# Patient Record
Sex: Male | Born: 1970 | Race: White | Hispanic: No | Marital: Married | Smoking: Current every day smoker
Health system: Southern US, Community
[De-identification: ages and names within clinical notes are randomized; demographics above are authoritative.]

## PROBLEM LIST (undated history)

## (undated) DIAGNOSIS — Z8719 Personal history of other diseases of the digestive system: Secondary | ICD-10-CM

## (undated) DIAGNOSIS — E785 Hyperlipidemia, unspecified: Secondary | ICD-10-CM

## (undated) DIAGNOSIS — K921 Melena: Secondary | ICD-10-CM

## (undated) DIAGNOSIS — Z8661 Personal history of infections of the central nervous system: Secondary | ICD-10-CM

## (undated) DIAGNOSIS — Z8619 Personal history of other infectious and parasitic diseases: Secondary | ICD-10-CM

## (undated) DIAGNOSIS — Z8701 Personal history of pneumonia (recurrent): Secondary | ICD-10-CM

## (undated) HISTORY — DX: Personal history of other diseases of the digestive system: Z87.19

## (undated) HISTORY — DX: Personal history of infections of the central nervous system: Z86.61

## (undated) HISTORY — DX: Personal history of pneumonia (recurrent): Z87.01

## (undated) HISTORY — DX: Melena: K92.1

## (undated) HISTORY — PX: SPINE SURGERY: SHX786

## (undated) HISTORY — DX: Hyperlipidemia, unspecified: E78.5

## (undated) HISTORY — DX: Personal history of other infectious and parasitic diseases: Z86.19

---

## 2000-03-12 ENCOUNTER — Emergency Department (HOSPITAL_COMMUNITY): Admission: EM | Admit: 2000-03-12 | Discharge: 2000-03-12 | Payer: Self-pay | Admitting: *Deleted

## 2003-04-29 ENCOUNTER — Emergency Department (HOSPITAL_COMMUNITY): Admission: EM | Admit: 2003-04-29 | Discharge: 2003-04-29 | Payer: Self-pay

## 2004-08-06 ENCOUNTER — Ambulatory Visit: Payer: Self-pay | Admitting: Internal Medicine

## 2004-11-23 ENCOUNTER — Ambulatory Visit: Payer: Self-pay | Admitting: Internal Medicine

## 2005-01-19 ENCOUNTER — Ambulatory Visit: Payer: Self-pay | Admitting: Internal Medicine

## 2005-05-24 ENCOUNTER — Ambulatory Visit: Payer: Self-pay | Admitting: Internal Medicine

## 2005-07-06 ENCOUNTER — Ambulatory Visit: Payer: Self-pay | Admitting: Internal Medicine

## 2005-07-07 ENCOUNTER — Ambulatory Visit: Payer: Self-pay | Admitting: Internal Medicine

## 2005-08-02 ENCOUNTER — Ambulatory Visit: Payer: Self-pay | Admitting: Internal Medicine

## 2005-11-29 ENCOUNTER — Ambulatory Visit: Payer: Self-pay | Admitting: Internal Medicine

## 2007-03-08 ENCOUNTER — Ambulatory Visit: Payer: Self-pay | Admitting: Internal Medicine

## 2008-04-03 ENCOUNTER — Ambulatory Visit: Payer: Self-pay | Admitting: Internal Medicine

## 2008-04-03 DIAGNOSIS — M79609 Pain in unspecified limb: Secondary | ICD-10-CM | POA: Insufficient documentation

## 2008-04-03 DIAGNOSIS — F3289 Other specified depressive episodes: Secondary | ICD-10-CM | POA: Insufficient documentation

## 2008-04-03 DIAGNOSIS — F329 Major depressive disorder, single episode, unspecified: Secondary | ICD-10-CM

## 2008-04-03 DIAGNOSIS — F411 Generalized anxiety disorder: Secondary | ICD-10-CM | POA: Insufficient documentation

## 2008-04-03 DIAGNOSIS — F4310 Post-traumatic stress disorder, unspecified: Secondary | ICD-10-CM | POA: Insufficient documentation

## 2008-07-30 ENCOUNTER — Ambulatory Visit: Payer: Self-pay | Admitting: Internal Medicine

## 2008-07-30 DIAGNOSIS — J069 Acute upper respiratory infection, unspecified: Secondary | ICD-10-CM | POA: Insufficient documentation

## 2008-07-30 DIAGNOSIS — H9190 Unspecified hearing loss, unspecified ear: Secondary | ICD-10-CM | POA: Insufficient documentation

## 2010-10-22 ENCOUNTER — Ambulatory Visit: Payer: Self-pay | Admitting: Internal Medicine

## 2013-10-17 ENCOUNTER — Telehealth: Payer: Self-pay

## 2013-10-17 NOTE — Telephone Encounter (Signed)
Agreed with ER.

## 2013-10-17 NOTE — Telephone Encounter (Signed)
I called pts wife Zachary Leon to get update and pt is not hurting and refuses to seek any medical assistance now. Zachary Leon pt needs to be evaluated at ED or UC. Zachary Leon voiced understanding.

## 2013-10-17 NOTE — Telephone Encounter (Signed)
pts wife,Shannon called; pt is in [place] driving on highway; pt pulled off the road and called 911 but could not get thru;? Problem with cell phone and location; pt called Larene Beach and said pt having severe chest pain that radiated down lt arm with SOB. Pt is not hurting now and said would wait for 1st appt with Dr Glori Bickers on 11/13/13. Pt has not seen a doctor in at least 3 years according to pts wife. No hx of heart condition but Larene Beach said last couple of nights pt has complained with really bad indigestion. Randa Ngo if pts pain was severe with radiation down lt arm and SOB even though not hurting now pt needs to pull over and try 911 again or go to UC or ED wherever pt is in [facility]. Larene Beach said she will call pt and advise.

## 2013-11-13 ENCOUNTER — Ambulatory Visit (INDEPENDENT_AMBULATORY_CARE_PROVIDER_SITE_OTHER): Payer: BC Managed Care – PPO | Admitting: Family Medicine

## 2013-11-13 ENCOUNTER — Encounter: Payer: Self-pay | Admitting: Family Medicine

## 2013-11-13 ENCOUNTER — Ambulatory Visit (INDEPENDENT_AMBULATORY_CARE_PROVIDER_SITE_OTHER)
Admission: RE | Admit: 2013-11-13 | Discharge: 2013-11-13 | Disposition: A | Discharge status: Home / Self Care | Payer: BC Managed Care – PPO | Source: Ambulatory Visit | Attending: Family Medicine | Admitting: Family Medicine

## 2013-11-13 VITALS — BP 128/92 | HR 62 | Temp 98.4°F | Ht 70.5 in | Wt 211.2 lb

## 2013-11-13 DIAGNOSIS — R5383 Other fatigue: Secondary | ICD-10-CM | POA: Insufficient documentation

## 2013-11-13 DIAGNOSIS — R413 Other amnesia: Secondary | ICD-10-CM

## 2013-11-13 DIAGNOSIS — R079 Chest pain, unspecified: Secondary | ICD-10-CM | POA: Insufficient documentation

## 2013-11-13 DIAGNOSIS — K219 Gastro-esophageal reflux disease without esophagitis: Secondary | ICD-10-CM | POA: Insufficient documentation

## 2013-11-13 DIAGNOSIS — F17200 Nicotine dependence, unspecified, uncomplicated: Secondary | ICD-10-CM | POA: Insufficient documentation

## 2013-11-13 DIAGNOSIS — K5900 Constipation, unspecified: Secondary | ICD-10-CM

## 2013-11-13 DIAGNOSIS — K5909 Other constipation: Secondary | ICD-10-CM

## 2013-11-13 DIAGNOSIS — R5381 Other malaise: Secondary | ICD-10-CM

## 2013-11-13 HISTORY — DX: Other amnesia: R41.3

## 2013-11-13 MED ORDER — OMEPRAZOLE 20 MG PO CPDR: 20.0000 mg | DELAYED_RELEASE_CAPSULE | Freq: Every day | ORAL | Status: DC

## 2013-11-13 NOTE — Patient Instructions (Addendum)
Follow up in about 2 weeks to review test results EKG and chest xray and labs today  Start omeprazole 20 mg each am for acid reflux  Take care of yourself and keep thinking about quitting smoking

## 2013-11-13 NOTE — Progress Notes (Signed)
Subjective:    Patient ID: Zachary Leon, male    DOB: 07/17/1973, 43 y.o.   MRN: [id number]  HPI New to [place] - here to est today with many medical concerns and symptoms   Has not been here for a while  Has seen Dr Marshall Cork in the distant past  This is closer for him anyway   Last Td was 7 years  Declines flu shots  Has acid reflux frequently- takes occasional med otc  ? What it was  Worst symptoms- acid in throat at night  Other times mild burning in throat/ indigestion and heart burn  No vomiting or abd pain or hx of PUD  Smoker - 1ppd He has quit once for 5 years  He knows that he gains wt when he quits - he gained 50 lb  He is not ready to quit yet  MIL lives with him and smokes as well   Having constipation- years  Hemorrhoids since age 18 - they bleed occasionally  If he does not take anything - he has a BM every 3 days or so  He takes metamucil every night- that handles it  Father has the same thing  ? May want to see GI in the future   Does not eat a balanced diet- not enough fruit / some vegetables  Does not drink enough water    No colon cancer in the family   Fatigue is also a problem for him  Gets plenty of sleep  ? 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   No symptoms of depression overall   Some issues "remembering things"  Short term memory problems - forgets parts of converstions and also reads same passage over and over  Does forget appointments  occ misplaces things - keys No episodes of confusion  Has not become lost - occ has to concentrate harder Concentration is not good overall  Pt is quite worried that he has the same brain tumor his father did because he has similar symptoms Wife tells him "he is not listening" No past dx  of ADD but did terribly in school    Also CP Episodes-one time per week  L side of chest- sharp and stabbing   10 min Not exertional  No sob or sweating or nausea  Had to pull car over - did call 911 - but they could not reach him    fam hx:  Father- died of complic of a brain tumor - pt is very worried that he may also have a brain tumor No CAD in family  MGM had skin cancer on top of head  Patient Active Problem List   Diagnosis Date Noted  . Smoking 11/13/2013   Past Medical History  Diagnosis Date  . Blood in stool   . History of chicken pox   . History of gastroesophageal reflux (GERD)   . History of meningitis     at age 71  . History of pneumococcal pneumonia     at age 69   No past surgical history on file. History  Substance Use Topics  . Smoking status: Current Every Day Smoker -- 1.00 packs/day    Types: Cigarettes  . Smokeless tobacco:  Not on file  . Alcohol Use: No     Comment: occ/socially   No family history on file. No Known Allergies No current outpatient prescriptions on file prior to visit.   No current facility-administered medications on file prior to visit.    Review of Systems Review of Systems  Constitutional: Negative for fever, appetite change,  and unexpected weight change.  ENT neg for congestion or st , pos for deviated septum  Eyes: Negative for pain and visual disturbance.  Respiratory: Negative for cough and shortness of breath.   Cardiovascular: Negative for cp or palpitations    Gastrointestinal: Negative for nausea, diarrhea and vomiting . pos for heartburn almost daily , neg for dark stool  Genitourinary: Negative for urgency and frequency.  Skin: Negative for pallor or rash   Neurological: Negative for weakness, light-headedness, numbness and headaches.  Hematological: Negative for adenopathy. Does not bruise/bleed easily.  Psychiatric/Behavioral: Negative for dysphoric mood. The patient is not nervous/anxious.  pos for  difficulty concentrating and problems with short term memory        Objective:   Physical Exam  Constitutional: He appears well-developed and well-nourished. No distress.  overwt and well app  HENT:  Head: Normocephalic and atraumatic.  Right Ear: External ear normal.  Left Ear: External ear normal.  Nose: Nose normal.  Mouth/Throat: Oropharynx is clear and moist.  Deviated septum noted   Eyes: Conjunctivae and EOM are normal. Pupils are equal, round, and reactive to light. Right eye exhibits no discharge. Left eye exhibits no discharge. No scleral icterus.  Neck: Normal range of motion. Neck supple. No JVD present. Carotid bruit is not present. No thyromegaly present.  Cardiovascular: Normal rate, regular rhythm, normal heart sounds and intact distal pulses.  Exam reveals no gallop.   Pulmonary/Chest: Effort normal and breath sounds normal. No respiratory distress. He has no wheezes. He exhibits no tenderness.  Abdominal: Soft. Bowel sounds are normal. He exhibits no distension, no abdominal bruit and no mass. There is no tenderness.  Musculoskeletal: He exhibits no edema and no tenderness.  Lymphadenopathy:    He has no cervical adenopathy.  Neurological: He is alert. He has normal reflexes. He displays no atrophy and no tremor. No cranial nerve deficit or sensory deficit. He exhibits normal muscle tone. Coordination and gait normal.  No focal cerebellar signs   Skin: Skin is warm and dry. No rash noted. No erythema. No pallor.  Psychiatric: He has a normal mood and affect.  Pt seems mildly anxious  He is attentive           Assessment & Plan:

## 2013-11-13 NOTE — Progress Notes (Signed)
Pre visit review using our clinic review tool, if applicable. No additional management support is needed unless otherwise documented below in the visit note. 

## 2013-11-14 DIAGNOSIS — K5909 Other constipation: Secondary | ICD-10-CM | POA: Insufficient documentation

## 2013-11-14 LAB — TSH: TSH: 2.111 u[IU]/mL (ref 0.350–4.500)

## 2013-11-14 LAB — COMPREHENSIVE METABOLIC PANEL
ALT: 62 U/L — ABNORMAL HIGH (ref 0–53)
AST: 38 U/L — AB (ref 0–37)
Albumin: 4.9 g/dL (ref 3.5–5.2)
Alkaline Phosphatase: 59 U/L (ref 39–117)
BILIRUBIN TOTAL: 0.8 mg/dL (ref 0.2–1.2)
BUN: 12 mg/dL (ref 6–23)
CO2: 28 mEq/L (ref 19–32)
CREATININE: 0.78 mg/dL (ref 0.50–1.35)
Calcium: 9.8 mg/dL (ref 8.4–10.5)
Chloride: 102 mEq/L (ref 96–112)
Glucose, Bld: 82 mg/dL (ref 70–99)
Potassium: 4.1 mEq/L (ref 3.5–5.3)
Sodium: 139 mEq/L (ref 135–145)
Total Protein: 7 g/dL (ref 6.0–8.3)

## 2013-11-14 LAB — CBC WITH DIFFERENTIAL/PLATELET
Basophils Absolute: 0 10*3/uL (ref 0.0–0.1)
Basophils Relative: 0 % (ref 0–1)
EOS PCT: 2 % (ref 0–5)
Eosinophils Absolute: 0.1 10*3/uL (ref 0.0–0.7)
HEMATOCRIT: 50.7 % (ref 39.0–52.0)
Hemoglobin: 17.6 g/dL — ABNORMAL HIGH (ref 13.0–17.0)
Lymphocytes Relative: 30 % (ref 12–46)
Lymphs Abs: 2 10*3/uL (ref 0.7–4.0)
MCH: 30.7 pg (ref 26.0–34.0)
MCHC: 34.7 g/dL (ref 30.0–36.0)
MCV: 88.5 fL (ref 78.0–100.0)
MONO ABS: 0.4 10*3/uL (ref 0.1–1.0)
MONOS PCT: 6 % (ref 3–12)
NEUTROS ABS: 4.2 10*3/uL (ref 1.7–7.7)
Neutrophils Relative %: 62 % (ref 43–77)
Platelets: 133 10*3/uL — ABNORMAL LOW (ref 150–400)
RBC: 5.73 MIL/uL (ref 4.22–5.81)
RDW: 14.7 % (ref 11.5–15.5)
WBC: 6.7 10*3/uL (ref 4.0–10.5)

## 2013-11-14 LAB — LIPID PANEL
Cholesterol: 214 mg/dL — ABNORMAL HIGH (ref 0–200)
HDL: 40 mg/dL (ref >39)
LDL CALC: 147 mg/dL — AB (ref 0–99)
TRIGLYCERIDES: 137 mg/dL (ref <150)
Total CHOL/HDL Ratio: 5.4 Ratio
VLDL: 27 mg/dL (ref 0–40)

## 2013-11-14 LAB — VITAMIN B12: Vitamin B-12: 361 pg/mL (ref 211–911)

## 2013-11-14 LAB — SEDIMENTATION RATE: SED RATE: 1 mm/h (ref 0–16)

## 2013-11-14 NOTE — Assessment & Plan Note (Signed)
This is chronic but much imp with daily metamucil-which is reassuring Some hemorrhoids as well  Disc need for better diet- more fruit and veg and water, as well as exercise Suspect IBS  No other symptoms Pt is worried about this and thinks he may want further eval with colonoscopy- we will disc that further at f/u

## 2013-11-14 NOTE — Assessment & Plan Note (Signed)
Lab today  Sleep apnea is possible with hx of snoring  Will f/u to disc further  If nl labs-consider a sleep eval

## 2013-11-14 NOTE — Assessment & Plan Note (Signed)
Stressed imp of control of this due to risk of esoph change/barretts/cancer Pt voiced understanding  Rev gerd diet and imp of wt loss  Will begin omeprazole 20 mg daily  F/u planned

## 2013-11-14 NOTE — Assessment & Plan Note (Signed)
Pt has trouble with concentration (long term)- as well as short term memory- somewhat worse over the last 2 y He is very worried that he could have the brain tumor his father had as well  We will disc at f/u  ? If anxiety could play a role as well as fatigue (? And adult ADD)  Will disc further at follow up

## 2013-11-14 NOTE — Assessment & Plan Note (Signed)
Disc in detail risks of smoking and possible outcomes including copd, vascular/ heart disease, cancer , respiratory and sinus infections  Pt voices understanding Pt states he is not ready to quit  He is concerned with wt gain when he does quit-we disc this in detail Will disc further at f/u

## 2013-11-14 NOTE — Assessment & Plan Note (Signed)
EKG and cxr wnl today ? If this could be reflux related/ poss esoph spasm  ? Stress related  Will check lab and f/u to eval further  Stressed imp of calling 911 for severe/ recurrent or persistent symptoms

## 2013-11-15 ENCOUNTER — Encounter: Payer: Self-pay | Admitting: *Deleted

## 2013-11-22 ENCOUNTER — Encounter: Payer: Self-pay | Admitting: Family Medicine

## 2013-11-22 ENCOUNTER — Ambulatory Visit (INDEPENDENT_AMBULATORY_CARE_PROVIDER_SITE_OTHER): Payer: BC Managed Care – PPO | Admitting: Family Medicine

## 2013-11-22 VITALS — BP 126/86 | HR 61 | Temp 98.5°F | Ht 70.5 in | Wt 209.2 lb

## 2013-11-22 DIAGNOSIS — E785 Hyperlipidemia, unspecified: Secondary | ICD-10-CM | POA: Insufficient documentation

## 2013-11-22 DIAGNOSIS — J069 Acute upper respiratory infection, unspecified: Secondary | ICD-10-CM

## 2013-11-22 DIAGNOSIS — K219 Gastro-esophageal reflux disease without esophagitis: Secondary | ICD-10-CM

## 2013-11-22 DIAGNOSIS — R079 Chest pain, unspecified: Secondary | ICD-10-CM

## 2013-11-22 DIAGNOSIS — F17200 Nicotine dependence, unspecified, uncomplicated: Secondary | ICD-10-CM

## 2013-11-22 NOTE — Patient Instructions (Signed)
Start the omeprazole I px -for acid reflux and chest symptoms  Stop up front for referral to cardiology  Once that is done - call and let me know so we can get you referred to the sleep clinic for eval of sleep apnea  Cholesterol is mildly high  Avoid red meat/ fried foods/ egg yolks/ fatty breakfast meats/ butter, cheese and high fat dairy/ and shellfish   Do cut alcohol intake to max 2 servings of alcohol per day  You can also get a B complex vitamin to help with energy level (over the counter)- and eat diet high in green leafy vegetables   Fat and Cholesterol Control Diet Fat and cholesterol levels in your blood and organs are influenced by your diet. High levels of fat and cholesterol may lead to diseases of the heart, small and large blood vessels, gallbladder, liver, and pancreas. CONTROLLING FAT AND CHOLESTEROL WITH DIET Although exercise and lifestyle factors are important, your diet is key. That is because certain foods are known to raise cholesterol and others to lower it. The goal is to balance foods for their effect on cholesterol and more importantly, to replace saturated and trans fat with other types of fat, such as monounsaturated fat, polyunsaturated fat, and omega-3 fatty acids. On average, a person should consume no more than 15 to 17 g of saturated fat daily. Saturated and trans fats are considered "bad" fats, and they will raise LDL cholesterol. Saturated fats are primarily found in animal products such as meats, butter, and cream. However, that does not mean you need to give up all your favorite foods. Today, there are good tasting, low-fat, low-cholesterol substitutes for most of the things you like to eat. Choose low-fat or nonfat alternatives. Choose round or loin cuts of red meat. These types of cuts are lowest in fat and cholesterol. Chicken (without the skin), fish, veal, and ground [place] breast are great choices. Eliminate fatty meats, such as hot dogs and salami. Even  shellfish have little or no saturated fat. Have a 3 oz (85 g) portion when you eat lean meat, poultry, or fish. Trans fats are also called "partially hydrogenated oils." They are oils that have been scientifically manipulated so that they are solid at room temperature resulting in a longer shelf life and improved taste and texture of foods in which they are added. Trans fats are found in stick margarine, some tub margarines, cookies, crackers, and baked goods.  When baking and cooking, oils are a great substitute for butter. The monounsaturated oils are especially beneficial since it is believed they lower LDL and raise HDL. The oils you should avoid entirely are saturated tropical oils, such as coconut and palm.  Remember to eat a lot from food groups that are naturally free of saturated and trans fat, including fish, fruit, vegetables, beans, grains (barley, rice, couscous, bulgur wheat), and pasta (without cream sauces).  IDENTIFYING FOODS THAT LOWER FAT AND CHOLESTEROL  Soluble fiber may lower your cholesterol. This type of fiber is found in fruits such as apples, vegetables such as broccoli, potatoes, and carrots, legumes such as beans, peas, and lentils, and grains such as barley. Foods fortified with plant sterols (phytosterol) may also lower cholesterol. You should eat at least 2 g per day of these foods for a cholesterol lowering effect.  Read package labels to identify low-saturated fats, trans fat free, and low-fat foods at the supermarket. Select cheeses that have only 2 to 3 g saturated fat per ounce. Use  a heart-healthy tub margarine that is free of trans fats or partially hydrogenated oil. When buying baked goods (cookies, crackers), avoid partially hydrogenated oils. Breads and muffins should be made from whole grains (whole-wheat or whole oat flour, instead of "flour" or "enriched flour"). Buy non-creamy canned soups with reduced salt and no added fats.  FOOD PREPARATION TECHNIQUES  Never  deep-fry. If you must fry, either stir-fry, which uses very little fat, or use non-stick cooking sprays. When possible, broil, bake, or roast meats, and steam vegetables. Instead of putting butter or margarine on vegetables, use lemon and herbs, applesauce, and cinnamon (for squash and sweet potatoes). Use nonfat yogurt, salsa, and low-fat dressings for salads.  LOW-SATURATED FAT / LOW-FAT FOOD SUBSTITUTES Meats / Saturated Fat (g)  Avoid: Steak, marbled (3 oz/85 g) / 11 g  Choose: Steak, lean (3 oz/85 g) / 4 g  Avoid: Hamburger (3 oz/85 g) / 7 g  Choose: Hamburger, lean (3 oz/85 g) / 5 g  Avoid: Ham (3 oz/85 g) / 6 g  Choose: Ham, lean cut (3 oz/85 g) / 2.4 g  Avoid: Chicken, with skin, dark meat (3 oz/85 g) / 4 g  Choose: Chicken, skin removed, dark meat (3 oz/85 g) / 2 g  Avoid: Chicken, with skin, light meat (3 oz/85 g) / 2.5 g  Choose: Chicken, skin removed, light meat (3 oz/85 g) / 1 g Dairy / Saturated Fat (g)  Avoid: Whole milk (1 cup) / 5 g  Choose: Low-fat milk, 2% (1 cup) / 3 g  Choose: Low-fat milk, 1% (1 cup) / 1.5 g  Choose: Skim milk (1 cup) / 0.3 g  Avoid: Hard cheese (1 oz/28 g) / 6 g  Choose: Skim milk cheese (1 oz/28 g) / 2 to 3 g  Avoid: Cottage cheese, 4% fat (1 cup) / 6.5 g  Choose: Low-fat cottage cheese, 1% fat (1 cup) / 1.5 g  Avoid: Ice cream (1 cup) / 9 g  Choose: Sherbet (1 cup) / 2.5 g  Choose: Nonfat frozen yogurt (1 cup) / 0.3 g  Choose: Frozen fruit bar / trace  Avoid: Whipped cream (1 tbs) / 3.5 g  Choose: Nondairy whipped topping (1 tbs) / 1 g Condiments / Saturated Fat (g)  Avoid: Mayonnaise (1 tbs) / 2 g  Choose: Low-fat mayonnaise (1 tbs) / 1 g  Avoid: Butter (1 tbs) / 7 g  Choose: Extra light margarine (1 tbs) / 1 g  Avoid: Coconut oil (1 tbs) / 11.8 g  Choose: Olive oil (1 tbs) / 1.8 g  Choose: Corn oil (1 tbs) / 1.7 g  Choose: Safflower oil (1 tbs) / 1.2 g  Choose: Sunflower oil (1 tbs) / 1.4 g  Choose:  Soybean oil (1 tbs) / 2.4 g  Choose: Canola oil (1 tbs) / 1 g Document Released: 09/05/2005 Document Revised: 12/31/2012 Document Reviewed: 02/24/2011 [employer] Patient Information 2014 [place], [place].

## 2013-11-22 NOTE — Progress Notes (Signed)
Subjective:    Patient ID: Zachary Leon, male    DOB: [birth year], 43 y.o.   MRN: [id number]  HPI Here for f/u of chronic medical issues as well as acute illness  [place] symptoms  For about a week  Thinks it is a cold  Green nasal drainage with blood  Ears are full and uncomfortable Tired and run down- but  No fever  Little cough -not much and not very productive , not wheezing  No n/v/d  Some pressure in sinuses-no pain  No otc meds    gerd  Was put on omeprazole 20 -- he did not take daily medicine - was resistant to it  He takes ranitidine as needed  Symptoms are the same but no episodes of cp   Smoking - still smoking and not ready to quit yet   Office Visit on 11/13/2013  Component Date Value Ref Range Status  . Cholesterol 11/13/2013 214* 0 - 200 mg/dL Final   Comment: ATP III Classification:                                < 200        mg/dL        Desirable                               200 - 239     mg/dL        Borderline High                               >= 240        mg/dL        High                             . Triglycerides 11/13/2013 137  <150 mg/dL Final  . HDL 11/13/2013 40  >39 mg/dL Final  . Total CHOL/HDL Ratio 11/13/2013 5.4   Final  . VLDL 11/13/2013 27  0 - 40 mg/dL Final  . LDL Cholesterol 11/13/2013 147* 0 - 99 mg/dL Final   Comment:                            Total Cholesterol/HDL Ratio:CHD Risk                                                 Coronary Heart Disease Risk Table                                                                 Men       Women                                   1/2 Average Risk  3.4        3.3                                       Average Risk              5.0        4.4                                    2X Average Risk              9.6        7.1                                    3X Average Risk             23.4       11.0                          Use the calculated Patient Ratio above and the CHD  Risk table                           to determine the patient's CHD Risk.                          ATP III Classification (LDL):                                < 100        mg/dL         Optimal                               100 - 129     mg/dL         Near or Above Optimal                               130 - 159     mg/dL         Borderline High                               160 - 189     mg/dL         High                                > 190        mg/dL         Very High                             . Sodium 11/13/2013 139  135 - 145 mEq/L Final  . Potassium 11/13/2013 4.1  3.5 - 5.3 mEq/L Final  . Chloride 11/13/2013 102  96 - 112 mEq/L Final  . CO2 11/13/2013 28  19 -  32 mEq/L Final  . Glucose, Bld 11/13/2013 82  70 - 99 mg/dL Final  . BUN 11/13/2013 12  6 - 23 mg/dL Final  . Creat 11/13/2013 0.78  0.50 - 1.35 mg/dL Final  . Total Bilirubin 11/13/2013 0.8  0.2 - 1.2 mg/dL Final   ** Please note change in reference range(s). **  . Alkaline Phosphatase 11/13/2013 59  39 - 117 U/L Final  . AST 11/13/2013 38* 0 - 37 U/L Final  . ALT 11/13/2013 62* 0 - 53 U/L Final  . Total Protein 11/13/2013 7.0  6.0 - 8.3 g/dL Final  . Albumin 11/13/2013 4.9  3.5 - 5.2 g/dL Final  . Calcium 11/13/2013 9.8  8.4 - 10.5 mg/dL Final  . WBC 11/13/2013 6.7  4.0 - 10.5 K/uL Final  . RBC 11/13/2013 5.73  4.22 - 5.81 MIL/uL Final  . Hemoglobin 11/13/2013 17.6* 13.0 - 17.0 g/dL Final  . HCT 11/13/2013 50.7  39.0 - 52.0 % Final  . MCV 11/13/2013 88.5  78.0 - 100.0 fL Final  . MCH 11/13/2013 30.7  26.0 - 34.0 pg Final  . MCHC 11/13/2013 34.7  30.0 - 36.0 g/dL Final  . RDW 11/13/2013 14.7  11.5 - 15.5 % Final  . Platelets 11/13/2013 133* 150 - 400 K/uL Final  . Neutrophils Relative % 11/13/2013 62  43 - 77 % Final  . Neutro Abs 11/13/2013 4.2  1.7 - 7.7 K/uL Final  . Lymphocytes Relative 11/13/2013 30  12 - 46 % Final  . Lymphs Abs 11/13/2013 2.0  0.7 - 4.0 K/uL Final  . Monocytes Relative 11/13/2013 6  3 -  12 % Final  . Monocytes Absolute 11/13/2013 0.4  0.1 - 1.0 K/uL Final  . Eosinophils Relative 11/13/2013 2  0 - 5 % Final  . Eosinophils Absolute 11/13/2013 0.1  0.0 - 0.7 K/uL Final  . Basophils Relative 11/13/2013 0  0 - 1 % Final  . Basophils Absolute 11/13/2013 0.0  0.0 - 0.1 K/uL Final  . Smear Review 11/13/2013 Criteria for review not met   Final  . TSH 11/13/2013 2.111  0.350 - 4.500 uIU/mL Final  . Sed Rate 11/13/2013 1  0 - 16 mm/hr Final  . Vitamin B-12 11/13/2013 361  211 - 911 pg/mL Final    Diet is fair  Chol is borderline with LDL in the 140s   Ast/alt are mildly elevated  4 drinks to 5 drinks per week Does not take acetaminophen  He in general needs to drink more water   Hb is high from smoking  Platelets 133- no problems with easy bleeding or bruising    Patient Active Problem List   Diagnosis Date Noted  . Hyperlipidemia 11/22/2013  . Constipation, chronic 11/14/2013  . Smoking 11/13/2013  . GERD (gastroesophageal reflux disease) 11/13/2013  . Chest pain 11/13/2013  . Memory loss 11/13/2013  . Fatigue 11/13/2013  . UNSPECIFIED HEARING LOSS 07/30/2008  . ANXIETY 04/03/2008  . DEPRESSION 04/03/2008  . Pain in Soft Tissues of Limb 04/03/2008   Past Medical History  Diagnosis Date  . Blood in stool   . History of chicken pox   . History of gastroesophageal reflux (GERD)   . History of meningitis     at age 71  . History of pneumococcal pneumonia     at age 59   No past surgical history on file. History  Substance Use Topics  . Smoking status: Current Every Day Smoker -- 1.00 packs/day  Types: Cigarettes  . Smokeless tobacco: Not on file  . Alcohol Use: No     Comment: occ/socially   Family History  Problem Relation Age of Onset  . Brain cancer Father     craniopharyngioma (? pituitary gland or near it)   No Known Allergies Current Outpatient Prescriptions on File Prior to Visit  Medication Sig Dispense Refill  . omeprazole (PRILOSEC) 20 MG  capsule Take 1 capsule (20 mg total) by mouth daily.  30 capsule  3   No current facility-administered medications on file prior to visit.    Review of Systems Review of Systems  Constitutional: Negative for fever, appetite change, fatigue and unexpected weight change.  Eyes: Negative for pain and visual disturbance.  ENT pos for cong and rhinorrhea  Respiratory: Negative for wheeze  and shortness of breath.   Cardiovascular: Negative for cp or palpitations    Gastrointestinal: Negative for nausea, diarrhea and constipation. pos for heartburn  Genitourinary: Negative for urgency and frequency.  Skin: Negative for pallor or rash   Neurological: Negative for weakness, light-headedness, numbness and headaches.  Hematological: Negative for adenopathy. Does not bruise/bleed easily.  Psychiatric/Behavioral: Negative for dysphoric mood. The patient is not nervous/anxious.         Objective:   Physical Exam  Constitutional: He appears well-developed and well-nourished. No distress.  HENT:  Head: Normocephalic and atraumatic.  Right Ear: External ear normal.  Left Ear: External ear normal.  Mouth/Throat: Oropharynx is clear and moist. No oropharyngeal exudate.  Nares are injected and congested  Clear rhinorrhea  No sinus tenderness   Eyes: Conjunctivae and EOM are normal. Right eye exhibits no discharge. Left eye exhibits no discharge. No scleral icterus.  Neck: Normal range of motion. Neck supple. No JVD present. No thyromegaly present.  Cardiovascular: Normal rate, regular rhythm and normal heart sounds.  Exam reveals no gallop.   Pulmonary/Chest: Effort normal and breath sounds normal. No respiratory distress. He has no wheezes. He has no rales. He exhibits no tenderness.  Abdominal: Soft. Bowel sounds are normal. He exhibits no distension. There is no tenderness. There is no rebound and no guarding.  Musculoskeletal: He exhibits no edema.  Lymphadenopathy:    He has no cervical  adenopathy.  Neurological: He is alert. He has normal reflexes. No cranial nerve deficit. He exhibits normal muscle tone. Coordination normal.  Skin: No rash noted.  Psychiatric: He has a normal mood and affect.          Assessment & Plan:

## 2013-11-22 NOTE — Progress Notes (Signed)
Pre visit review using our clinic review tool, if applicable. No additional management support is needed unless otherwise documented below in the visit note. 

## 2013-11-23 DIAGNOSIS — J069 Acute upper respiratory infection, unspecified: Secondary | ICD-10-CM | POA: Insufficient documentation

## 2013-11-23 NOTE — Assessment & Plan Note (Signed)
Disc symptomatic care - see instructions on AVS  Update if not starting to improve in a week or if worsening

## 2013-11-23 NOTE — Assessment & Plan Note (Signed)
Disc in detail risks of smoking and possible outcomes including copd, vascular/ heart disease, cancer , respiratory and sinus infections  Pt voices understanding Pt is not ready to quit yet  

## 2013-11-23 NOTE — Assessment & Plan Note (Signed)
Disc goals for lipids and reasons to control them Rev labs with pt Rev low sat fat diet in detail  Given handout on low cholesterol diet

## 2013-11-23 NOTE — Assessment & Plan Note (Signed)
Pt will try the omeprazole to see if more imp that zantac and update

## 2013-11-23 NOTE — Assessment & Plan Note (Signed)
Reassuring EKG but pt has had exertional symptoms  Ref to cardiol for further eval and stress testing  Pt inst to go to ER if symptoms return or persist

## 2013-11-26 ENCOUNTER — Ambulatory Visit: Payer: BC Managed Care – PPO | Admitting: Cardiovascular Disease

## 2013-12-12 ENCOUNTER — Ambulatory Visit: Payer: BC Managed Care – PPO | Admitting: Cardiovascular Disease

## 2014-10-24 ENCOUNTER — Encounter (INDEPENDENT_AMBULATORY_CARE_PROVIDER_SITE_OTHER): Payer: BLUE CROSS/BLUE SHIELD | Admitting: Ophthalmology

## 2014-10-24 DIAGNOSIS — H2512 Age-related nuclear cataract, left eye: Secondary | ICD-10-CM

## 2014-10-24 DIAGNOSIS — H43813 Vitreous degeneration, bilateral: Secondary | ICD-10-CM

## 2014-10-24 DIAGNOSIS — H538 Other visual disturbances: Secondary | ICD-10-CM

## 2014-10-24 DIAGNOSIS — D3132 Benign neoplasm of left choroid: Secondary | ICD-10-CM

## 2014-12-23 ENCOUNTER — Ambulatory Visit (INDEPENDENT_AMBULATORY_CARE_PROVIDER_SITE_OTHER): Payer: BLUE CROSS/BLUE SHIELD | Admitting: Family Medicine

## 2014-12-23 ENCOUNTER — Telehealth: Payer: Self-pay | Admitting: Family Medicine

## 2014-12-23 ENCOUNTER — Encounter: Payer: Self-pay | Admitting: Family Medicine

## 2014-12-23 VITALS — BP 136/88 | HR 73 | Temp 98.3°F | Ht 70.5 in | Wt 211.8 lb

## 2014-12-23 DIAGNOSIS — F17200 Nicotine dependence, unspecified, uncomplicated: Secondary | ICD-10-CM

## 2014-12-23 DIAGNOSIS — H547 Unspecified visual loss: Secondary | ICD-10-CM | POA: Diagnosis not present

## 2014-12-23 DIAGNOSIS — E785 Hyperlipidemia, unspecified: Secondary | ICD-10-CM | POA: Diagnosis not present

## 2014-12-23 DIAGNOSIS — K219 Gastro-esophageal reflux disease without esophagitis: Secondary | ICD-10-CM

## 2014-12-23 DIAGNOSIS — Z720 Tobacco use: Secondary | ICD-10-CM

## 2014-12-23 LAB — COMPREHENSIVE METABOLIC PANEL
ALK PHOS: 64 U/L (ref 39–117)
ALT: 40 U/L (ref 0–53)
AST: 27 U/L (ref 0–37)
Albumin: 4.4 g/dL (ref 3.5–5.2)
BUN: 14 mg/dL (ref 6–23)
CHLORIDE: 105 meq/L (ref 96–112)
CO2: 29 mEq/L (ref 19–32)
CREATININE: 0.87 mg/dL (ref 0.40–1.50)
Calcium: 9.5 mg/dL (ref 8.4–10.5)
GFR: 101.52 mL/min (ref >60.00)
GLUCOSE: 99 mg/dL (ref 70–99)
Potassium: 4.1 mEq/L (ref 3.5–5.1)
Sodium: 138 mEq/L (ref 135–145)
Total Bilirubin: 0.9 mg/dL (ref 0.2–1.2)
Total Protein: 7 g/dL (ref 6.0–8.3)

## 2014-12-23 LAB — LIPID PANEL
CHOL/HDL RATIO: 6
Cholesterol: 193 mg/dL (ref 0–200)
HDL: 34 mg/dL — ABNORMAL LOW (ref >39.00)
LDL Cholesterol: 136 mg/dL — ABNORMAL HIGH (ref 0–99)
NONHDL: 159
Triglycerides: 117 mg/dL (ref 0.0–149.0)
VLDL: 23.4 mg/dL (ref 0.0–40.0)

## 2014-12-23 MED ORDER — OMEPRAZOLE 20 MG PO CPDR: 20.0000 mg | DELAYED_RELEASE_CAPSULE | Freq: Every day | ORAL | Status: DC

## 2014-12-23 NOTE — Progress Notes (Signed)
Pre visit review using our clinic review tool, if applicable. No additional management support is needed unless otherwise documented below in the visit note. 

## 2014-12-23 NOTE — Progress Notes (Signed)
Subjective:    Patient ID: Zachary Leon, male    DOB: [birth year], 44 y.o.   MRN: [id number]  HPI Here for f/u of GERD and other health problems   Also having an eye problem In Sept had infection in eye - put on med from opthy (for conjunctivitis) He feels pressure in his eyes and his R eye flutters Vision is blurred in his L eye - infection symptoms are better - no redness/itching or d/c (may be dry)  He had an exam- retina was checked and ok    Taking omeprazole and it has helped a lot  Still having symptoms 1-2 times per week - if he eats the wrong thing  Foods: tomato based/ pasta with sauce/pizza , spicy foods , V8 juice (acidic)  Some coffee  Alcohol intake - avg 0-1 drink (no etoh in 3 weeks) - prior ast/alt were up   Still smoking - but cut way back (5 cig since Sunday)- is using patch today  Really trying to quit    Wt is up 2 lb   Wants to check his labs for liver today and also cholesterol Has not changed diet for cholesterol Lab Results  Component Value Date   CHOL 214* 11/13/2013   HDL 40 11/13/2013   LDLCALC 147* 11/13/2013   TRIG 137 11/13/2013   CHOLHDL 5.4 11/13/2013     Patient Active Problem List   Diagnosis Date Noted  . Viral URI 11/23/2013  . Hyperlipidemia 11/22/2013  . Constipation, chronic 11/14/2013  . Smoking 11/13/2013  . GERD (gastroesophageal reflux disease) 11/13/2013  . Chest pain 11/13/2013  . Memory loss 11/13/2013  . Fatigue 11/13/2013  . UNSPECIFIED HEARING LOSS 07/30/2008  . ANXIETY 04/03/2008  . DEPRESSION 04/03/2008  . Pain in Soft Tissues of Limb 04/03/2008   Past Medical History  Diagnosis Date  . Blood in stool   . History of chicken pox   . History of gastroesophageal reflux (GERD)   . History of meningitis     at age 67  . History of pneumococcal pneumonia     at age 40   No past surgical history on file. History  Substance Use Topics  . Smoking status: Current Every Day Smoker -- 1.00 packs/day    Types:  Cigarettes  . Smokeless tobacco: Not on file  . Alcohol Use: No     Comment: occ/socially   Family History  Problem Relation Age of Onset  . Brain cancer Father     craniopharyngioma (? pituitary gland or near it)   No Known Allergies Current Outpatient Prescriptions on File Prior to Visit  Medication Sig Dispense Refill  . omeprazole (PRILOSEC) 20 MG capsule Take 1 capsule (20 mg total) by mouth daily. 30 capsule 3   No current facility-administered medications on file prior to visit.      Review of Systems Review of Systems  Constitutional: Negative for fever, appetite change, fatigue and unexpected weight change.  Eyes: Negative for pain and pos for vision change and pressure in eyes .  Respiratory: Negative for cough and shortness of breath.   Cardiovascular: Negative for cp or palpitations    Gastrointestinal: Negative for nausea, diarrhea and constipation. pos for indigestion if he eats wrong  Genitourinary: Negative for urgency and frequency.  Skin: Negative for pallor or rash   Neurological: Negative for weakness, light-headedness, numbness and headaches.  Hematological: Negative for adenopathy. Does not bruise/bleed easily.  Psychiatric/Behavioral: Negative for dysphoric mood. The patient is  not nervous/anxious.         Objective:   Physical Exam  Constitutional: He appears well-developed and well-nourished. No distress.  overwt and well app  HENT:  Head: Normocephalic and atraumatic.  Mouth/Throat: Oropharynx is clear and moist.  Eyes: Conjunctivae and EOM are normal. Pupils are equal, round, and reactive to light. Right eye exhibits no discharge. Left eye exhibits no discharge. No scleral icterus.  Neck: Normal range of motion. Neck supple. No JVD present. Carotid bruit is not present. No thyromegaly present.  Cardiovascular: Normal rate, regular rhythm, normal heart sounds and intact distal pulses.  Exam reveals no gallop.   Pulmonary/Chest: Effort normal and  breath sounds normal. No respiratory distress. He has no wheezes. He exhibits no tenderness.  Abdominal: Soft. Bowel sounds are normal. He exhibits no distension, no abdominal bruit and no mass. There is no tenderness.  Musculoskeletal: He exhibits no edema.  Lymphadenopathy:    He has no cervical adenopathy.  Neurological: He is alert. He has normal reflexes. No cranial nerve deficit. He exhibits normal muscle tone. Coordination normal.  Skin: Skin is warm and dry. No rash noted. No erythema. No pallor.  Psychiatric: He has a normal mood and affect.  Pt seems mildly anxious           Assessment & Plan:   Problem List Items Addressed This Visit      Digestive   GERD (gastroesophageal reflux disease) - Primary    Urged to continue omeprazole and also avoid GERD causing foods Handout given  If no further imp will inc PPI to bid       Relevant Medications   omeprazole (PRILOSEC) capsule     Other   Hyperlipidemia    Lipid panel today Diet has not changed very much      Relevant Orders   Comprehensive metabolic panel (Completed)   Lipid panel (Completed)   Smoking    Disc in detail risks of smoking and possible outcomes including copd, vascular/ heart disease, cancer , respiratory and sinus infections  Pt voices understanding Pt is making attempt to quit- commended on that  Using patches  Counseled on this       Vision problem    L eye -s/p conjunctivitis in fall/winter  Also needs glasses  Some feeling of eye pressure  Ref to opth      Relevant Orders   Ambulatory referral to Ophthalmology

## 2014-12-23 NOTE — Telephone Encounter (Signed)
emmi emailed °

## 2014-12-23 NOTE — Patient Instructions (Signed)
Continue omeprazole for acid reflux and avoid foods that cause it  Lab today  Good work with cutting smoking and alcohol Stop at check out for referral to eye doctor

## 2014-12-24 ENCOUNTER — Encounter: Payer: Self-pay | Admitting: Family Medicine

## 2014-12-24 ENCOUNTER — Telehealth: Payer: Self-pay

## 2014-12-24 NOTE — Assessment & Plan Note (Signed)
Disc in detail risks of smoking and possible outcomes including copd, vascular/ heart disease, cancer , respiratory and sinus infections  Pt voices understanding Pt is making attempt to quit- commended on that  Using patches  Counseled on this

## 2014-12-24 NOTE — Telephone Encounter (Signed)
Patient informed of lab results and recommendations. 

## 2014-12-24 NOTE — Assessment & Plan Note (Signed)
Urged to continue omeprazole and also avoid GERD causing foods Handout given  If no further imp will inc PPI to bid

## 2014-12-24 NOTE — Assessment & Plan Note (Signed)
L eye -s/p conjunctivitis in fall/winter  Also needs glasses  Some feeling of eye pressure  Ref to opth

## 2014-12-24 NOTE — Assessment & Plan Note (Signed)
Lipid panel today Diet has not changed very much

## 2014-12-24 NOTE — Telephone Encounter (Signed)
-----   Message from Abner Greenspan, MD sent at 12/23/2014  5:17 PM EDT ----- Liver tests are back to normal  Cholesterol is a bit improved but HDL is too low  Increase exercise to inc HDL (Avoid red meat/ fried foods/ egg yolks/ fatty breakfast meats/ butter, cheese and high fat dairy/ and shellfish to dec bad cholesterol)

## 2015-07-19 ENCOUNTER — Emergency Department: Payer: BLUE CROSS/BLUE SHIELD

## 2015-07-19 ENCOUNTER — Emergency Department
Admission: EM | Admit: 2015-07-19 | Discharge: 2015-07-19 | Disposition: A | Discharge status: Home / Self Care | Payer: BLUE CROSS/BLUE SHIELD | Attending: Emergency Medicine | Admitting: Emergency Medicine

## 2015-07-19 ENCOUNTER — Encounter: Payer: Self-pay | Admitting: Emergency Medicine

## 2015-07-19 DIAGNOSIS — R05 Cough: Secondary | ICD-10-CM | POA: Diagnosis present

## 2015-07-19 DIAGNOSIS — Z720 Tobacco use: Secondary | ICD-10-CM | POA: Insufficient documentation

## 2015-07-19 DIAGNOSIS — J069 Acute upper respiratory infection, unspecified: Secondary | ICD-10-CM | POA: Diagnosis not present

## 2015-07-19 DIAGNOSIS — Z79899 Other long term (current) drug therapy: Secondary | ICD-10-CM | POA: Insufficient documentation

## 2015-07-19 LAB — CBC WITH DIFFERENTIAL/PLATELET
BASOS ABS: 0.1 10*3/uL (ref 0–0.1)
Basophils Relative: 1 %
EOS ABS: 0.1 10*3/uL (ref 0–0.7)
EOS PCT: 2 %
HEMATOCRIT: 49.7 % (ref 40.0–52.0)
HEMOGLOBIN: 16.7 g/dL (ref 13.0–18.0)
LYMPHS PCT: 23 %
Lymphs Abs: 1.5 10*3/uL (ref 1.0–3.6)
MCH: 30.1 pg (ref 26.0–34.0)
MCHC: 33.7 g/dL (ref 32.0–36.0)
MCV: 89.4 fL (ref 80.0–100.0)
MONO ABS: 0.4 10*3/uL (ref 0.2–1.0)
Monocytes Relative: 5 %
Neutro Abs: 4.8 10*3/uL (ref 1.4–6.5)
Neutrophils Relative %: 69 %
Platelets: 125 10*3/uL — ABNORMAL LOW (ref 150–440)
RBC: 5.57 MIL/uL (ref 4.40–5.90)
RDW: 13.9 % (ref 11.5–14.5)
WBC: 6.9 10*3/uL (ref 3.8–10.6)

## 2015-07-19 LAB — BASIC METABOLIC PANEL
ANION GAP: 8 (ref 5–15)
BUN: 13 mg/dL (ref 6–20)
CHLORIDE: 109 mmol/L (ref 101–111)
CO2: 23 mmol/L (ref 22–32)
Calcium: 9.5 mg/dL (ref 8.9–10.3)
Creatinine, Ser: 0.84 mg/dL (ref 0.61–1.24)
GFR calc non Af Amer: >60 mL/min (ref >60)
Glucose, Bld: 128 mg/dL — ABNORMAL HIGH (ref 65–99)
POTASSIUM: 3.8 mmol/L (ref 3.5–5.1)
SODIUM: 140 mmol/L (ref 135–145)

## 2015-07-19 MED ORDER — GUAIFENESIN-CODEINE 100-10 MG/5ML PO SOLN: 10.0000 mL | ORAL | Status: DC | PRN

## 2015-07-19 MED ORDER — AZITHROMYCIN 250 MG PO TABS: ORAL_TABLET | ORAL | Status: DC

## 2015-07-19 MED ORDER — PSEUDOEPHEDRINE HCL 60 MG PO TABS: 60.0000 mg | ORAL_TABLET | ORAL | Status: DC | PRN

## 2015-07-19 NOTE — Discharge Instructions (Signed)
Upper Respiratory Infection, Adult Most upper respiratory infections (URIs) are a viral infection of the air passages leading to the lungs. A URI affects the nose, throat, and upper air passages. The most common type of URI is nasopharyngitis and is typically referred to as "the common cold." URIs run their course and usually go away on their own. Most of the time, a URI does not require medical attention, but sometimes a bacterial infection in the upper airways can follow a viral infection. This is called a secondary infection. Sinus and middle ear infections are common types of secondary upper respiratory infections. Bacterial pneumonia can also complicate a URI. A URI can worsen asthma and chronic obstructive pulmonary disease (COPD). Sometimes, these complications can require emergency medical care and may be life threatening.  CAUSES Almost all URIs are caused by viruses. A virus is a type of germ and can spread from one person to another.  RISKS FACTORS You may be at risk for a URI if:   You smoke.   You have chronic heart or lung disease.  You have a weakened defense (immune) system.   You are very young or very old.   You have nasal allergies or asthma.  You work in crowded or poorly ventilated areas.  You work in health care facilities or schools. SIGNS AND SYMPTOMS  Symptoms typically develop 2-3 days after you come in contact with a cold virus. Most viral URIs last 7-10 days. However, viral URIs from the influenza virus (flu virus) can last 14-18 days and are typically more severe. Symptoms may include:   Runny or stuffy (congested) nose.   Sneezing.   Cough.   Sore throat.   Headache.   Fatigue.   Fever.   Loss of appetite.   Pain in your forehead, behind your eyes, and over your cheekbones (sinus pain).  Muscle aches.  DIAGNOSIS  Your health care provider may diagnose a URI by:  Physical exam.  Tests to check that your symptoms are not due to  another condition such as:  Strep throat.  Sinusitis.  Pneumonia.  Asthma. TREATMENT  A URI goes away on its own with time. It cannot be cured with medicines, but medicines may be prescribed or recommended to relieve symptoms. Medicines may help:  Reduce your fever.  Reduce your cough.  Relieve nasal congestion. HOME CARE INSTRUCTIONS   Take medicines only as directed by your health care provider.   Gargle warm saltwater or take cough drops to comfort your throat as directed by your health care provider.  Use a warm mist humidifier or inhale steam from a shower to increase air moisture. This may make it easier to breathe.  Drink enough fluid to keep your urine clear or pale yellow.   Eat soups and other clear broths and maintain good nutrition.   Rest as needed.   Return to work when your temperature has returned to normal or as your health care provider advises. You may need to stay home longer to avoid infecting others. You can also use a face mask and careful hand washing to prevent spread of the virus.  Increase the usage of your inhaler if you have asthma.   Do not use any tobacco products, including cigarettes, chewing tobacco, or electronic cigarettes. If you need help quitting, ask your health care provider. PREVENTION  The best way to protect yourself from getting a cold is to practice good hygiene.   Avoid oral or hand contact with people with cold   symptoms.   Wash your hands often if contact occurs.  There is no clear evidence that vitamin C, vitamin E, echinacea, or exercise reduces the chance of developing a cold. However, it is always recommended to get plenty of rest, exercise, and practice good nutrition.  SEEK MEDICAL CARE IF:   You are getting worse rather than better.   Your symptoms are not controlled by medicine.   You have chills.  You have worsening shortness of breath.  You have brown or red mucus.  You have yellow or brown nasal  discharge.  You have pain in your face, especially when you bend forward.  You have a fever.  You have swollen neck glands.  You have pain while swallowing.  You have white areas in the back of your throat. SEEK IMMEDIATE MEDICAL CARE IF:   You have severe or persistent:  Headache.  Ear pain.  Sinus pain.  Chest pain.  You have chronic lung disease and any of the following:  Wheezing.  Prolonged cough.  Coughing up blood.  A change in your usual mucus.  You have a stiff neck.  You have changes in your:  Vision.  Hearing.  Thinking.  Mood. MAKE SURE YOU:   Understand these instructions.  Will watch your condition.  Will get help right away if you are not doing well or get worse.   This information is not intended to replace advice given to you by your health care provider. Make sure you discuss any questions you have with your health care provider.   Document Released: 03/01/2001 Document Revised: 01/20/2015 Document Reviewed: 12/11/2013 Elsevier Interactive Patient Education 2016 [employer]  

## 2015-07-19 NOTE — ED Notes (Signed)
States he has not felt well since last Tuesday.. Cough  Congestion

## 2015-07-19 NOTE — ED Provider Notes (Signed)
[facility] Emergency Department Provider Note  ____________________________________________  Time seen: Approximately 10:03 AM  I have reviewed the triage vital signs and the nursing notes.   HISTORY  Chief Complaint Cough    HPI Zachary Leon is a 44 y.o. male who presents to the emergency room for evaluation of "not feeling well since last Tuesday". Patient reports coughing, congestion, stuffed up. Nitrol fever and chills. Stop smoking using the patch however just feels all weak.   Past Medical History  Diagnosis Date  . Blood in stool   . History of chicken pox   . History of gastroesophageal reflux (GERD)   . History of meningitis     at age 27  . History of pneumococcal pneumonia     at age 46    Patient Active Problem List   Diagnosis Date Noted  . Vision problem 12/23/2014  . Viral URI 11/23/2013  . Hyperlipidemia 11/22/2013  . Constipation, chronic 11/14/2013  . Smoking 11/13/2013  . GERD (gastroesophageal reflux disease) 11/13/2013  . Chest pain 11/13/2013  . Memory loss 11/13/2013  . Fatigue 11/13/2013  . UNSPECIFIED HEARING LOSS 07/30/2008  . ANXIETY 04/03/2008  . DEPRESSION 04/03/2008  . Pain in limb 04/03/2008    History reviewed. No pertinent past surgical history.  Current Outpatient Rx  Name  Route  Sig  Dispense  Refill  . azithromycin (ZITHROMAX Z-PAK) 250 MG tablet      Take 2 tablets (500 mg) on  Day 1,  followed by 1 tablet (250 mg) once daily on Days 2 through 5.   6 each   0   . guaiFENesin-codeine 100-10 MG/5ML syrup   Oral   Take 10 mLs by mouth every 4 (four) hours as needed for cough.   180 mL   0   . nicotine (NICODERM CQ - DOSED IN MG/24 HOURS) 21 mg/24hr patch   Transdermal   Place 21 mg onto the skin daily.         Marland Kitchen omeprazole (PRILOSEC) 20 MG capsule   Oral   Take 1 capsule (20 mg total) by mouth daily.   90 capsule   3   . pseudoephedrine (SUDAFED) 60 MG tablet   Oral   Take 1 tablet  (60 mg total) by mouth every 4 (four) hours as needed for congestion.   24 tablet   0     Allergies Review of patient's allergies indicates no known allergies.  Family History  Problem Relation Age of Onset  . Brain cancer Father     craniopharyngioma (? pituitary gland or near it)    Social History Social History  Substance Use Topics  . Smoking status: Current Every Day Smoker -- 1.00 packs/day    Types: Cigarettes  . Smokeless tobacco: None  . Alcohol Use: No     Comment: occ/socially    Review of Systems Constitutional: No fever/chills Eyes: No visual changes. ENT: No sore throat. Cardiovascular: Denies chest pain. Respiratory: Denies shortness of breath. Positive for cough Gastrointestinal: No abdominal pain.  No nausea, no vomiting.  No diarrhea.  No constipation. Genitourinary: Negative for dysuria. Musculoskeletal: Negative for back pain. Skin: Negative for rash. Neurological: Negative for headaches, focal weakness or numbness.  10-point ROS otherwise negative.  ____________________________________________   PHYSICAL EXAM:  VITAL SIGNS: ED Triage Vitals  Enc Vitals Group     BP 07/19/15 0953 144/74 mmHg     Pulse Rate 07/19/15 0953 66     Resp 07/19/15  0953 20     Temp 07/19/15 0953 98.2 F (36.8 C)     Temp Source 07/19/15 0953 Oral     SpO2 07/19/15 0953 97 %     Weight 07/19/15 0953 200 lb (90.719 kg)     Height 07/19/15 0953 5\' 10"  (1.778 m)     Head Cir --      Peak Flow --      Pain Score 07/19/15 0950 7     Pain Loc --      Pain Edu? --      Excl. in [place]? --     Constitutional: Alert and oriented. Well appearing and in no acute distress. Eyes: Conjunctivae are normal. PERRL. EOMI. Head: Atraumatic. Nose: No congestion/rhinnorhea. Mouth/Throat: Mucous membranes are moist.  Oropharynx non-erythematous. Neck: No stridor.   Cardiovascular: Normal rate, regular rhythm. Grossly normal heart sounds.  Good peripheral  circulation. Respiratory: Normal respiratory effort.  No retractions. Lungs CTAB. Gastrointestinal: Soft and nontender. No distention. No abdominal bruits. No CVA tenderness. Musculoskeletal: No lower extremity tenderness nor edema.  No joint effusions. Neurologic:  Normal speech and language. No gross focal neurologic deficits are appreciated. No gait instability. Skin:  Skin is warm, dry and intact. No rash noted. Psychiatric: Mood and affect are normal. Speech and behavior are normal.  ____________________________________________   LABS (all labs ordered are listed, but only abnormal results are displayed)  Labs Reviewed  CBC WITH DIFFERENTIAL/PLATELET - Abnormal; Notable for the following:    Platelets 125 (*)    All other components within normal limits  BASIC METABOLIC PANEL - Abnormal; Notable for the following:    Glucose, Bld 128 (*)    All other components within normal limits   ____________________________________________  EKG   ____________________________________________  RADIOLOGY  Chest x-ray negative ____________________________________________   PROCEDURES  Procedure(s) performed: None  Critical Care performed: No  ____________________________________________   INITIAL IMPRESSION / ASSESSMENT AND PLAN / ED COURSE  Pertinent labs & imaging results that were available during my care of the patient were reviewed by me and considered in my medical decision making (see chart for details).  Acute upper respiratory infection. Rx given for Z-Pak, Robitussin-AC. Encourage patient follow-up with PCP or return to the ER with any worsening symptomology.  Patient voices no other emergency medical complaints at this time and needs a work note. ____________________________________________   FINAL CLINICAL IMPRESSION(S) / ED DIAGNOSES  Final diagnoses:  URI, acute      Arlyss Repress, PA-C 07/19/15 [place], MD 07/19/15 [phone]

## 2015-07-19 NOTE — ED Notes (Signed)
Pt states cough and congestion for several days, states back pain and pain when breathing and coughing, hx of smoker

## 2016-01-11 ENCOUNTER — Ambulatory Visit (INDEPENDENT_AMBULATORY_CARE_PROVIDER_SITE_OTHER): Payer: BLUE CROSS/BLUE SHIELD | Admitting: Primary Care

## 2016-01-11 ENCOUNTER — Encounter: Payer: Self-pay | Admitting: Primary Care

## 2016-01-11 VITALS — BP 136/88 | HR 84 | Temp 100.6°F | Ht 70.5 in | Wt 210.0 lb

## 2016-01-11 DIAGNOSIS — K219 Gastro-esophageal reflux disease without esophagitis: Secondary | ICD-10-CM | POA: Diagnosis not present

## 2016-01-11 DIAGNOSIS — J029 Acute pharyngitis, unspecified: Secondary | ICD-10-CM | POA: Diagnosis not present

## 2016-01-11 DIAGNOSIS — H6692 Otitis media, unspecified, left ear: Secondary | ICD-10-CM

## 2016-01-11 LAB — POCT RAPID STREP A (OFFICE): Rapid Strep A Screen: NEGATIVE

## 2016-01-11 LAB — POC INFLUENZA A&B (BINAX/QUICKVUE)
INFLUENZA A, POC: NEGATIVE
Influenza B, POC: NEGATIVE

## 2016-01-11 MED ORDER — OMEPRAZOLE 20 MG PO CPDR: 20.0000 mg | DELAYED_RELEASE_CAPSULE | Freq: Every day | ORAL | Status: AC

## 2016-01-11 MED ORDER — AMOXICILLIN 500 MG PO CAPS: 500.0000 mg | ORAL_CAPSULE | Freq: Two times a day (BID) | ORAL | Status: DC

## 2016-01-11 NOTE — Progress Notes (Signed)
Subjective:    Patient ID: Zachary Leon, male    DOB: [birth year], 45 y.o.   MRN: [id number]  HPI  Zachary Leon is a 45 year old male who presents today with a chief complaint of sore throat. He also reports fever, nasal congestion, headache, body aches. His fever was running 102 this morning and is at 100.6 today in the clinic after taking Aleve. His symptoms have been present for the past 2-3 days. He's been around his wife who was diagnosed with a sinus and ear infection. He's taken Aleve with improvement in fevers.   Review of Systems  Constitutional: Positive for fever, chills and fatigue.  HENT: Positive for congestion. Negative for sinus pressure.   Respiratory: Negative for cough and shortness of breath.   Cardiovascular: Negative for chest pain.  Musculoskeletal: Positive for myalgias.  Neurological: Positive for headaches.       Past Medical History  Diagnosis Date  . Blood in stool   . History of chicken pox   . History of gastroesophageal reflux (GERD)   . History of meningitis     at age 56  . History of pneumococcal pneumonia     at age 27     Social History   Social History  . Marital Status: Married    Spouse Name: N/A  . Number of Children: N/A  . Years of Education: N/A   Occupational History  . Not on file.   Social History Main Topics  . Smoking status: Current Every Day Smoker -- 1.00 packs/day    Types: Cigarettes  . Smokeless tobacco: Not on file  . Alcohol Use: No     Comment: occ/socially  . Drug Use: No  . Sexual Activity: Not on file   Other Topics Concern  . Not on file   Social History Narrative    No past surgical history on file.  Family History  Problem Relation Age of Onset  . Brain cancer Father     craniopharyngioma (? pituitary gland or near it)    No Known Allergies  Current Outpatient Prescriptions on File Prior to Visit  Medication Sig Dispense Refill  . omeprazole (PRILOSEC) 20 MG capsule Take 1 capsule (20 mg  total) by mouth daily. 90 capsule 3  . nicotine (NICODERM CQ - DOSED IN MG/24 HOURS) 21 mg/24hr patch Place 21 mg onto the skin daily. Reported on 01/11/2016     No current facility-administered medications on file prior to visit.    BP 136/88 mmHg  Pulse 84  Temp(Src) 100.6 F (38.1 C) (Oral)  Ht 5' 10.5" (1.791 m)  Wt 210 lb (95.255 kg)  BMI 29.70 kg/m2  SpO2 98%    Objective:   Physical Exam  Constitutional: He appears ill.  HENT:  Right Ear: Tympanic membrane and ear canal normal.  Left Ear: There is swelling. Tympanic membrane is erythematous.  Nose: Right sinus exhibits no maxillary sinus tenderness and no frontal sinus tenderness. Left sinus exhibits no maxillary sinus tenderness and no frontal sinus tenderness.  Mouth/Throat: Posterior oropharyngeal edema and posterior oropharyngeal erythema present. No oropharyngeal exudate.  Left canal with moderate cerumen impaction. Canal and TM post irrigation with moderate erythema and swelling.  Cardiovascular: Normal rate and regular rhythm.   Pulmonary/Chest: Effort normal and breath sounds normal. He has no wheezes. He has no rales.  Skin: Skin is warm and dry.          Assessment & Plan:  Acute Otitis Media:  Also with congestion, fevers, body aches, chills. Fever of 100.6 today after tylenol. Rapid Flu: Negative Rapid Strep: Negative Left canal and TM post irrigation: moderate erythema to canal and TM representative of acute otitis media. RX for Amoxil BID course. Continue Aleve PRN. Follow up PRN.

## 2016-01-11 NOTE — Patient Instructions (Signed)
Your flu and strep tests were negative.  You have an infection to your left ear.  Start amoxicillin antibiotics. Take 1 tablet by mouth twice daily for 7 days.  Continue Tylenol or Aleve as needed for fevers and body aches.  Ensure you are staying hydrated with water.  It was a pleasure meeting you!

## 2016-01-11 NOTE — Progress Notes (Signed)
Pre visit review using our clinic review tool, if applicable. No additional management support is needed unless otherwise documented below in the visit note. 

## 2016-01-14 ENCOUNTER — Telehealth: Payer: Self-pay

## 2016-01-14 DIAGNOSIS — J029 Acute pharyngitis, unspecified: Secondary | ICD-10-CM

## 2016-01-14 MED ORDER — AMOXICILLIN-POT CLAVULANATE 875-125 MG PO TABS: 1.0000 | ORAL_TABLET | Freq: Two times a day (BID) | ORAL | Status: DC

## 2016-01-14 NOTE — Telephone Encounter (Signed)
Please notify patient to stop Amoxicillin and start Augmentin. Take 1 tablet by mouth twice daily. Please have him update [place] if no improvement in 3-4 days.

## 2016-01-14 NOTE — Telephone Encounter (Signed)
Called and notified patient of Kate's comments. Patient verbalized understanding.  

## 2016-01-14 NOTE — Telephone Encounter (Signed)
V/M left; pt was seen and started abx on 01/11/16.  pt has been taking abx but S/T is no better; no fever now; gargling salt water has not helped throat.pt wants to know if should change abx or what to do. Medicap.Pt needs new work note to be out of work from 01/11/16 - 01/15/16. (pt cannot find the first note given to be out of work 01/11/16 and 01/12/16.) pt request cb.

## 2016-01-28 ENCOUNTER — Encounter: Payer: Self-pay | Admitting: Internal Medicine

## 2016-01-28 ENCOUNTER — Ambulatory Visit (INDEPENDENT_AMBULATORY_CARE_PROVIDER_SITE_OTHER): Payer: BLUE CROSS/BLUE SHIELD | Admitting: Internal Medicine

## 2016-01-28 VITALS — BP 122/78 | HR 69 | Temp 98.4°F | Wt 207.2 lb

## 2016-01-28 DIAGNOSIS — R5383 Other fatigue: Secondary | ICD-10-CM | POA: Diagnosis not present

## 2016-01-28 DIAGNOSIS — J029 Acute pharyngitis, unspecified: Secondary | ICD-10-CM

## 2016-01-28 MED ORDER — METHYLPREDNISOLONE ACETATE 80 MG/ML IJ SUSP
80.0000 mg | Freq: Once | INTRAMUSCULAR | Status: AC
  Administered 2016-01-28: 80 mg via INTRAMUSCULAR

## 2016-01-28 NOTE — Progress Notes (Signed)
Pre visit review using our clinic review tool, if applicable. No additional management support is needed unless otherwise documented below in the visit note. 

## 2016-01-28 NOTE — Patient Instructions (Signed)

## 2016-01-28 NOTE — Progress Notes (Signed)
HPI  Pt presents to the clinic today with c/o lack of energy and sore throat. This started 2 weeks ago. He was seen for the same 4/24 by Allie Bossier, NP-C. Rapid flu and strep were both negative at that time. He was diagnosed with a left otitis media, prescribed Amoxil. He called back 3 days later with no improvement. His Amoxil was switched to Augmentin. He finished the abx 4-5 days ago but reports continued fatigue and sore throat. His left ear pain is improved. He denies difficulty swallowing. He denies fever, chills or cough. He has not taken anything additional OTC.  Review of Systems    Past Medical History  Diagnosis Date  . Blood in stool   . History of chicken pox   . History of gastroesophageal reflux (GERD)   . History of meningitis     at age 65  . History of pneumococcal pneumonia     at age 73    Family History  Problem Relation Age of Onset  . Brain cancer Father     craniopharyngioma (? pituitary gland or near it)    Social History   Social History  . Marital Status: Married    Spouse Name: N/A  . Number of Children: N/A  . Years of Education: N/A   Occupational History  . Not on file.   Social History Main Topics  . Smoking status: Current Every Day Smoker -- 1.00 packs/day    Types: Cigarettes  . Smokeless tobacco: Not on file  . Alcohol Use: No     Comment: occ/socially  . Drug Use: No  . Sexual Activity: Not on file   Other Topics Concern  . Not on file   Social History Narrative    No Known Allergies   Constitutional: Positive fatigue. Denies headache, fever or abrupt weight changes.  HEENT:  Positive sore throat. Denies eye redness, ear pain, ringing in the ears, wax buildup, runny nose or bloody nose. Respiratory:  Denies cough, difficulty breathing or shortness of breath.  Cardiovascular: Denies chest pain, chest tightness, palpitations or swelling in the hands or feet.   No other specific complaints in a complete review of systems  (except as listed in HPI above).  Objective:  BP 122/78 mmHg  Pulse 69  Temp(Src) 98.4 F (36.9 C) (Oral)  Wt 207 lb 4 oz (94.008 kg)  SpO2 97%   General: Appears his stated age, well developed, well nourished in NAD. HEENT: Head: normal shape and size, no sinus tenderness noted; Eyes: sclera white, no icterus, conjunctiva pink; Ears: Tm's pink but intact, normal light reflex; Throat/Mouth: Teeth present, mucosa erythematous and moist, no exudate noted, no lesions or ulcerations noted.  Neck:  No adenopathy noted.  Cardiovascular: Normal rate and rhythm. S1,S2 noted.  No murmur, rubs or gallops noted.  Pulmonary/Chest: Normal effort and positive vesicular breath sounds. No respiratory distress. No wheezes, rales or ronchi noted.      Assessment & Plan:   Fatigue, sore throat:  No s/s of ongoing infection No indication for repeat abx 80 mg Depo IM today Ibuprofen and salt water gargles for sore throat  RTC as needed or if symptoms persist.

## 2019-05-25 MED ORDER — PINENE (L-ALPHA) POWD
Status: DC
Start: ? — End: 2019-05-25

## 2019-05-25 MED ORDER — VICON FORTE PO CAPS
17.00 | ORAL_CAPSULE | ORAL | Status: DC
Start: 2019-05-24 — End: 2019-05-25

## 2019-05-25 MED ORDER — IFOSFAMIDE IV
1000.00 | INTRAVENOUS | Status: DC
Start: ? — End: 2019-05-25

## 2019-05-25 MED ORDER — Medication
500.00 | Status: DC
Start: 2019-05-24 — End: 2019-05-25

## 2019-05-25 MED ORDER — EMBRACE PUMP SET-PIERCING PIN MISC
1.00 | Status: DC
Start: ? — End: 2019-05-25

## 2019-05-25 MED ORDER — DICLOXACILLIN SODIUM 62.5 MG/5ML PO SUSR
10.00 | ORAL | Status: DC
Start: 2019-05-25 — End: 2019-05-25

## 2019-05-25 MED ORDER — GLUCOLESS PO CAPS
200.00 | ORAL_CAPSULE | ORAL | Status: DC
Start: 2019-05-25 — End: 2019-05-25

## 2019-05-25 MED ORDER — Medication
2.00 | Status: DC
Start: 2019-05-24 — End: 2019-05-25

## 2019-05-25 MED ORDER — METHYLPHENIDATE HCL POWD
100.00 | Status: DC
Start: ? — End: 2019-05-25

## 2019-05-25 MED ORDER — EQL PEDIATRIC ELECTROLYTE PO SOLN
0.40 | ORAL | Status: DC
Start: 2019-05-25 — End: 2019-05-25

## 2019-05-25 MED ORDER — Medication
Status: DC
Start: ? — End: 2019-05-25

## 2019-05-25 MED ORDER — QUINERVA 260 MG PO TABS
325.00 | ORAL_TABLET | ORAL | Status: DC
Start: 2019-05-25 — End: 2019-05-25

## 2019-08-05 ENCOUNTER — Other Ambulatory Visit: Payer: Self-pay | Admitting: Internal Medicine

## 2019-08-05 ENCOUNTER — Other Ambulatory Visit: Payer: Self-pay

## 2019-08-05 ENCOUNTER — Ambulatory Visit
Admission: RE | Admit: 2019-08-05 | Discharge: 2019-08-05 | Disposition: A | Discharge status: Home / Self Care | Payer: BLUE CROSS/BLUE SHIELD | Source: Ambulatory Visit | Attending: Internal Medicine | Admitting: Internal Medicine

## 2019-08-05 DIAGNOSIS — K5900 Constipation, unspecified: Secondary | ICD-10-CM

## 2019-12-16 LAB — HM COLONOSCOPY

## 2019-12-19 ENCOUNTER — Ambulatory Visit: Payer: Managed Care, Other (non HMO) | Attending: Internal Medicine
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
## 2022-12-06 ENCOUNTER — Encounter: Payer: Self-pay | Admitting: Family Medicine

## 2022-12-06 ENCOUNTER — Ambulatory Visit (INDEPENDENT_AMBULATORY_CARE_PROVIDER_SITE_OTHER): Payer: Medicaid Other | Admitting: Family Medicine

## 2022-12-06 VITALS — BP 123/71 | HR 62 | Ht 70.5 in | Wt 202.0 lb

## 2022-12-06 DIAGNOSIS — D696 Thrombocytopenia, unspecified: Secondary | ICD-10-CM | POA: Diagnosis not present

## 2022-12-06 DIAGNOSIS — E785 Hyperlipidemia, unspecified: Secondary | ICD-10-CM | POA: Diagnosis not present

## 2022-12-06 DIAGNOSIS — Z139 Encounter for screening, unspecified: Secondary | ICD-10-CM | POA: Diagnosis not present

## 2022-12-06 DIAGNOSIS — E782 Mixed hyperlipidemia: Secondary | ICD-10-CM

## 2022-12-06 DIAGNOSIS — F17200 Nicotine dependence, unspecified, uncomplicated: Secondary | ICD-10-CM

## 2022-12-06 DIAGNOSIS — D171 Benign lipomatous neoplasm of skin and subcutaneous tissue of trunk: Secondary | ICD-10-CM

## 2022-12-06 DIAGNOSIS — E1169 Type 2 diabetes mellitus with other specified complication: Secondary | ICD-10-CM

## 2022-12-06 DIAGNOSIS — D179 Benign lipomatous neoplasm, unspecified: Secondary | ICD-10-CM | POA: Insufficient documentation

## 2022-12-06 DIAGNOSIS — G8929 Other chronic pain: Secondary | ICD-10-CM | POA: Insufficient documentation

## 2022-12-06 DIAGNOSIS — G8921 Chronic pain due to trauma: Secondary | ICD-10-CM

## 2022-12-06 DIAGNOSIS — E876 Hypokalemia: Secondary | ICD-10-CM | POA: Diagnosis not present

## 2022-12-06 DIAGNOSIS — F4310 Post-traumatic stress disorder, unspecified: Secondary | ICD-10-CM

## 2022-12-06 DIAGNOSIS — K5909 Other constipation: Secondary | ICD-10-CM
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
## 2022-12-06 NOTE — Patient Instructions (Signed)
It was nice to meet you today,   I have refilled your linzess.    I have ordered some lab tests and will let you know the results when we get them.    To talk with a therapist, go to the [email or web address] website and search for therapists in [place].  You can filter by specialty and insurance.  You should look for someone who specializes in ptsd or trauma.    Have a great day,   Dr. Jeannine Kitten

## 2022-12-06 NOTE — Assessment & Plan Note (Signed)
Multiple previous cbc values with plt < 150k. Other cell lines normal.  -cbc.   - may need referral to heme/onc.

## 2022-12-06 NOTE — Assessment & Plan Note (Signed)
Noncompliant with atorvastatin for past six months - check lipid panel
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
## 2022-12-06 NOTE — Assessment & Plan Note (Signed)
Takes linzess for chronic constipation/ibs-c.   - continue linzess
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
## 2022-12-06 NOTE — Assessment & Plan Note (Signed)
At least 3 small lipomas on the trunk. Nontender.  Provided reassurance.

## 2022-12-06 NOTE — Assessment & Plan Note (Signed)
Counseled on cessation. Patient has no desire to quit at this time.  - discuss annual low dose ct screening at next visit.  - will need AAA screening at age 52

## 2022-12-07 ENCOUNTER — Telehealth (HOSPITAL_BASED_OUTPATIENT_CLINIC_OR_DEPARTMENT_OTHER): Payer: Self-pay | Admitting: *Deleted

## 2022-12-07 MED ORDER — LINACLOTIDE 72 MCG PO CAPS: 72.0000 ug | ORAL_CAPSULE | Freq: Every day | ORAL | 11 refills | Status: DC

## 2022-12-07 NOTE — Telephone Encounter (Signed)
Contacted [facility] and added lab, confirmed with provider what he needed and the lady added it.

## 2022-12-07 NOTE — Telephone Encounter (Signed)
-----   Message from Benay Pike, MD sent at 12/07/2022  8:11 AM EDT ----- Regarding: blood tests I need to add a pathologist smear review and save smear order to the cbc I got yesterday on this guy.  The lab tech said we need to call [facility] to order this.  Should be [id number] and LAB [id number].   Linna Hoff

## 2022-12-08 LAB — CBC
Hematocrit: 50.9 % (ref 37.5–51.0)
Hemoglobin: 17.3 g/dL (ref 13.0–17.7)
MCH: 31.1 pg (ref 26.6–33.0)
MCHC: 34 g/dL (ref 31.5–35.7)
MCV: 92 fL (ref 79–97)
Platelets: 130 10*3/uL — ABNORMAL LOW (ref 150–450)
RBC: 5.56 x10E6/uL (ref 4.14–5.80)
RDW: 13.5 % (ref 11.6–15.4)
WBC: 8.2 10*3/uL (ref 3.4–10.8)

## 2022-12-08 LAB — BASIC METABOLIC PANEL
BUN/Creatinine Ratio: 13 (ref 9–20)
BUN: 11 mg/dL (ref 6–24)
CO2: 21 mmol/L (ref 20–29)
Calcium: 9.5 mg/dL (ref 8.7–10.2)
Chloride: 105 mmol/L (ref 96–106)
Creatinine, Ser: 0.84 mg/dL (ref 0.76–1.27)
Glucose: 97 mg/dL (ref 70–99)
Potassium: 4.5 mmol/L (ref 3.5–5.2)
Sodium: 142 mmol/L (ref 134–144)
eGFR: 106 mL/min/{1.73_m2} (ref >59)

## 2022-12-08 LAB — LIPID PANEL
Chol/HDL Ratio: 4.3 ratio (ref 0.0–5.0)
Cholesterol, Total: 172 mg/dL (ref 100–199)
HDL: 40 mg/dL (ref >39)
LDL Chol Calc (NIH): 113 mg/dL — ABNORMAL HIGH (ref 0–99)
Triglycerides: 103 mg/dL (ref 0–149)
VLDL Cholesterol Cal: 19 mg/dL (ref 5–40)

## 2022-12-08 LAB — HIV ANTIBODY (ROUTINE TESTING W REFLEX): HIV Screen 4th Generation wRfx: NONREACTIVE

## 2022-12-08 LAB — HEPATITIS C ANTIBODY: Hep C Virus Ab: NONREACTIVE

## 2022-12-09 LAB — PATHOLOGIST SMEAR REVIEW
Basophils Absolute: 0.1 10*3/uL (ref 0.0–0.2)
Basos: 1 %
EOS (ABSOLUTE): 0.1 10*3/uL (ref 0.0–0.4)
Eos: 1 %
Hematocrit: 52.7 % — ABNORMAL HIGH (ref 37.5–51.0)
Hemoglobin: 17.1 g/dL (ref 13.0–17.7)
Immature Grans (Abs): 0 10*3/uL (ref 0.0–0.1)
Immature Granulocytes: 0 %
Lymphocytes Absolute: 1.5 10*3/uL (ref 0.7–3.1)
Lymphs: 18 %
MCH: 31.3 pg (ref 26.6–33.0)
MCHC: 32.4 g/dL (ref 31.5–35.7)
MCV: 96 fL (ref 79–97)
Monocytes Absolute: 0.4 10*3/uL (ref 0.1–0.9)
Monocytes: 5 %
Neutrophils Absolute: 6.3 10*3/uL (ref 1.4–7.0)
Neutrophils: 75 %
Platelets: 126 10*3/uL — ABNORMAL LOW (ref 150–450)
RBC: 5.47 x10E6/uL (ref 4.14–5.80)
RDW: 13.7 % (ref 11.6–15.4)
WBC: 8.4 10*3/uL (ref 3.4–10.8)

## 2022-12-09 LAB — SPECIMEN STATUS REPORT

## 2022-12-12 ENCOUNTER — Other Ambulatory Visit: Payer: Self-pay | Admitting: Family Medicine

## 2022-12-12 MED ORDER — ATORVASTATIN CALCIUM 20 MG PO TABS: 20.0000 mg | ORAL_TABLET | Freq: Every day | ORAL | 3 refills | Status: DC

## 2022-12-12 NOTE — Progress Notes (Signed)
Spoke with pt regarding his cholesterol.  Will send in statin medication.    Pt did not want further workup of his thrombocytopenia

## 2023-02-06 DIAGNOSIS — M5136 Other intervertebral disc degeneration, lumbar region: Secondary | ICD-10-CM | POA: Diagnosis not present

## 2023-02-06 DIAGNOSIS — M47816 Spondylosis without myelopathy or radiculopathy, lumbar region: Secondary | ICD-10-CM | POA: Diagnosis not present

## 2023-02-06 DIAGNOSIS — M4726 Other spondylosis with radiculopathy, lumbar region: Secondary | ICD-10-CM | POA: Diagnosis not present

## 2023-02-06 DIAGNOSIS — G894 Chronic pain syndrome: Secondary | ICD-10-CM | POA: Diagnosis not present

## 2023-02-06 DIAGNOSIS — Z79891 Long term (current) use of opiate analgesic: Secondary | ICD-10-CM | POA: Diagnosis not present

## 2023-02-06 DIAGNOSIS — Z5181 Encounter for therapeutic drug level monitoring: Secondary | ICD-10-CM | POA: Diagnosis not present

## 2023-02-06 DIAGNOSIS — R202 Paresthesia of skin: Secondary | ICD-10-CM | POA: Diagnosis not present

## 2023-02-06 DIAGNOSIS — M62830 Muscle spasm of back: Secondary | ICD-10-CM | POA: Diagnosis not present

## 2023-03-06 DIAGNOSIS — G894 Chronic pain syndrome: Secondary | ICD-10-CM | POA: Diagnosis not present

## 2023-03-06 DIAGNOSIS — M62830 Muscle spasm of back: Secondary | ICD-10-CM | POA: Diagnosis not present

## 2023-03-06 DIAGNOSIS — M5136 Other intervertebral disc degeneration, lumbar region: Secondary | ICD-10-CM | POA: Diagnosis not present

## 2023-03-06 DIAGNOSIS — R202 Paresthesia of skin: Secondary | ICD-10-CM | POA: Diagnosis not present

## 2023-03-06 DIAGNOSIS — M4726 Other spondylosis with radiculopathy, lumbar region: Secondary | ICD-10-CM | POA: Diagnosis not present

## 2023-03-06 DIAGNOSIS — M47816 Spondylosis without myelopathy or radiculopathy, lumbar region: Secondary | ICD-10-CM | POA: Diagnosis not present

## 2023-03-07 ENCOUNTER — Ambulatory Visit (INDEPENDENT_AMBULATORY_CARE_PROVIDER_SITE_OTHER): Payer: Medicaid Other | Admitting: Family Medicine

## 2023-03-07 ENCOUNTER — Encounter: Payer: Self-pay | Admitting: Family Medicine

## 2023-03-07 VITALS — BP 131/77 | HR 69 | Temp 97.9°F | Ht 71.0 in | Wt 202.0 lb

## 2023-03-07 DIAGNOSIS — R339 Retention of urine, unspecified: Secondary | ICD-10-CM | POA: Diagnosis not present

## 2023-03-07 DIAGNOSIS — E782 Mixed hyperlipidemia: Secondary | ICD-10-CM | POA: Diagnosis not present

## 2023-03-07 DIAGNOSIS — N529 Male erectile dysfunction, unspecified: Secondary | ICD-10-CM | POA: Insufficient documentation

## 2023-03-07 MED ORDER — SILDENAFIL CITRATE 50 MG PO TABS: 50.0000 mg | ORAL_TABLET | Freq: Every day | ORAL | 1 refills | Status: DC | PRN

## 2023-03-07 MED ORDER — TAMSULOSIN HCL 0.4 MG PO CAPS: 0.4000 mg | ORAL_CAPSULE | Freq: Every day | ORAL | 3 refills | Status: AC
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
## 2023-03-07 NOTE — Patient Instructions (Signed)
It was nice to see you today,  We addressed the following topics today: -I have prescribed tamsulosin for you to take once a day.  This can help with the urinary symptoms.  If this medicine is not helping after the first month, let us know we may need to increase it to 2 tablets a day. - I have prescribed you generic Viagra.  Use this medication as needed.  Take one 50 mg tablet 3 to 4 hours before intercourse.  If this is not helpful, the next time you use it try 2 tablets.  Do not take more than 2 tablets at a time without talking to me first. - We can get your lab test at any time.  I will put the orders in now and you can schedule a lab visit the next time you are in town.  Have a great day,  Frederic Jericho, MD

## 2023-03-07 NOTE — Assessment & Plan Note (Signed)
Signs and symptoms consistent with enlarged prostate - Tamsulosin prescribed - PSA ordered

## 2023-03-07 NOTE — Assessment & Plan Note (Signed)
Patient states he has been taking his cholesterol medicine maybe "3 times a week".  Wants to take his medicine daily for the next 30 days prior to getting his cholesterol checked. - Recheck cholesterol in 30 days - Continue statin

## 2023-03-07 NOTE — Assessment & Plan Note (Signed)
Prescription for generic Viagra 50 mg given.

## 2023-04-03 DIAGNOSIS — R202 Paresthesia of skin: Secondary | ICD-10-CM | POA: Diagnosis not present

## 2023-04-03 DIAGNOSIS — M4726 Other spondylosis with radiculopathy, lumbar region: Secondary | ICD-10-CM | POA: Diagnosis not present

## 2023-04-03 DIAGNOSIS — M62830 Muscle spasm of back: Secondary | ICD-10-CM | POA: Diagnosis not present

## 2023-04-03 DIAGNOSIS — M5136 Other intervertebral disc degeneration, lumbar region: Secondary | ICD-10-CM | POA: Diagnosis not present

## 2023-04-03 DIAGNOSIS — G894 Chronic pain syndrome: Secondary | ICD-10-CM | POA: Diagnosis not present

## 2023-04-03 DIAGNOSIS — M47816 Spondylosis without myelopathy or radiculopathy, lumbar region: Secondary | ICD-10-CM | POA: Diagnosis not present

## 2023-04-06 ENCOUNTER — Other Ambulatory Visit: Payer: Medicaid Other

## 2023-05-01 DIAGNOSIS — M47816 Spondylosis without myelopathy or radiculopathy, lumbar region: Secondary | ICD-10-CM | POA: Diagnosis not present

## 2023-05-01 DIAGNOSIS — M4726 Other spondylosis with radiculopathy, lumbar region: Secondary | ICD-10-CM | POA: Diagnosis not present

## 2023-05-01 DIAGNOSIS — M62830 Muscle spasm of back: Secondary | ICD-10-CM | POA: Diagnosis not present

## 2023-05-01 DIAGNOSIS — Z79891 Long term (current) use of opiate analgesic: Secondary | ICD-10-CM | POA: Diagnosis not present

## 2023-05-01 DIAGNOSIS — G894 Chronic pain syndrome: Secondary | ICD-10-CM | POA: Diagnosis not present

## 2023-05-01 DIAGNOSIS — R202 Paresthesia of skin: Secondary | ICD-10-CM | POA: Diagnosis not present

## 2023-05-01 DIAGNOSIS — M5136 Other intervertebral disc degeneration, lumbar region: Secondary | ICD-10-CM | POA: Diagnosis not present

## 2023-05-27 ENCOUNTER — Other Ambulatory Visit: Payer: Self-pay | Admitting: Family Medicine

## 2023-05-29 DIAGNOSIS — Z79891 Long term (current) use of opiate analgesic: Secondary | ICD-10-CM | POA: Diagnosis not present

## 2023-05-29 DIAGNOSIS — M47816 Spondylosis without myelopathy or radiculopathy, lumbar region: Secondary | ICD-10-CM | POA: Diagnosis not present

## 2023-05-29 DIAGNOSIS — R202 Paresthesia of skin: Secondary | ICD-10-CM | POA: Diagnosis not present

## 2023-05-29 DIAGNOSIS — M4726 Other spondylosis with radiculopathy, lumbar region: Secondary | ICD-10-CM | POA: Diagnosis not present

## 2023-05-29 DIAGNOSIS — G894 Chronic pain syndrome: Secondary | ICD-10-CM | POA: Diagnosis not present

## 2023-05-29 DIAGNOSIS — M62830 Muscle spasm of back: Secondary | ICD-10-CM | POA: Diagnosis not present

## 2023-05-29 DIAGNOSIS — M5136 Other intervertebral disc degeneration, lumbar region: Secondary | ICD-10-CM | POA: Diagnosis not present

## 2023-06-08 ENCOUNTER — Encounter: Payer: Medicaid Other | Admitting: Family Medicine

## 2023-07-03 DIAGNOSIS — M545 Low back pain, unspecified: Secondary | ICD-10-CM | POA: Diagnosis not present

## 2023-07-03 DIAGNOSIS — M79605 Pain in left leg: Secondary | ICD-10-CM | POA: Diagnosis not present

## 2023-07-03 DIAGNOSIS — M6281 Muscle weakness (generalized): Secondary | ICD-10-CM | POA: Diagnosis not present

## 2023-07-03 DIAGNOSIS — Z79891 Long term (current) use of opiate analgesic: Secondary | ICD-10-CM | POA: Diagnosis not present

## 2023-07-03 DIAGNOSIS — G894 Chronic pain syndrome: Secondary | ICD-10-CM | POA: Diagnosis not present

## 2023-07-04 ENCOUNTER — Encounter: Payer: Self-pay | Admitting: Family Medicine

## 2023-07-04 ENCOUNTER — Ambulatory Visit (INDEPENDENT_AMBULATORY_CARE_PROVIDER_SITE_OTHER): Payer: Medicaid Other | Admitting: Family Medicine

## 2023-07-04 VITALS — BP 129/79 | HR 65 | Ht 71.0 in | Wt 202.8 lb

## 2023-07-04 DIAGNOSIS — F17200 Nicotine dependence, unspecified, uncomplicated: Secondary | ICD-10-CM

## 2023-07-04 DIAGNOSIS — E782 Mixed hyperlipidemia: Secondary | ICD-10-CM

## 2023-07-04 DIAGNOSIS — D696 Thrombocytopenia, unspecified: Secondary | ICD-10-CM

## 2023-07-04 DIAGNOSIS — R339 Retention of urine, unspecified: Secondary | ICD-10-CM | POA: Diagnosis not present

## 2023-07-04 DIAGNOSIS — N529 Male erectile dysfunction, unspecified: Secondary | ICD-10-CM

## 2023-07-04 MED ORDER — ATORVASTATIN CALCIUM 40 MG PO TABS
40.0000 mg | ORAL_TABLET | Freq: Every day | ORAL | 3 refills | Status: DC
Start: 2023-07-04 — End: 2024-07-15

## 2023-07-04 NOTE — Assessment & Plan Note (Signed)
Medication working well.  No questions or concerns.  Refill as needed.

## 2023-07-04 NOTE — Assessment & Plan Note (Signed)
Chronic, stable, mild.  Recheck CBC today.  Previous pathology smear review did not show anything abnormal structurally with the platelets.  If worsening will refer to hematology.  Continue to check every 6 months

## 2023-07-04 NOTE — Progress Notes (Signed)
Established Patient Office Visit  Subjective   Patient ID: Zachary Leon, male    DOB: 06-23-1971  Age: 52 y.o. MRN: [id number]  Chief Complaint  Patient presents with   Annual Exam    HPI  BPH symptoms-patient is taking his Flomax.  No issues with the medication.  Feels like it is working well.  We discussed getting a PSA test that we had already previously ordered.  Erectile dysfunction-patient taking Viagra.  No issues with this medication.  Feels like is working well.  Hyperlipidemia-patient still forgetting to take it on several days a week.  Estimates he takes it every other day.  No side effects from taking it.  No muscle aches.  We discussed muscle aches as the most common side effect and to let us know if he experiences any side effects when increasing the dose.  Thrombocytopenia-we discussed the pathology smear results.  Discussed rechecking it routinely and if it worsens we will refer to hematology but otherwise we will just continue to monitor periodically.  Patient states he had a colonoscopy in 2021.  Believes it was at [facility].  We will request records from them.  Discussed needing to know when results before knowing when he will need another cast.  The 10-year ASCVD risk score (Arnett DK, et al., 2019) is: 16.6%  Health Maintenance Due  Topic Date Due   Colonoscopy  Never done   Lung Cancer Screening  Never done   COVID-19 Vaccine (1 - 2023-24 season) Never done      Objective:     BP 129/79   Pulse 65   Ht 5\' 11"  (1.803 m)   Wt 202 lb 12.8 oz (92 kg)   SpO2 98%   BMI 28.28 kg/m    Physical Exam General: Alert, oriented Pulmonary: No respiratory distress Psych: Pleasant affect   No results found for any visits on 07/04/23.      Assessment & Plan:   Thrombocytopenia ([facility]) Assessment & Plan: Chronic, stable, mild.  Recheck CBC today.  Previous pathology smear review did not show anything abnormal structurally with the  platelets.  If worsening will refer to hematology.  Continue to check every 6 months  Orders: -     CBC  Mixed hyperlipidemia Assessment & Plan: Intermittently compliant.  Will increase the dose to 40 mg on the presumption that he will continue to forget to take doses occasionally.  Advised him that 40 mg even every other day would be better than 20 mg every other day and lowering his cholesterol. - Recheck cholesterol panel today - Increase to 40 mg Lipitor   Orders: -     Atorvastatin Calcium; Take 1 tablet (40 mg total) by mouth daily.  Dispense: 90 tablet; Refill: 3 -     Lipid panel  Urinary retention Assessment & Plan: Tolerating Flomax well.  Has improved his symptoms.  Checking PSA today.  Orders: -     PSA  Tobacco use disorder Assessment & Plan: Still alternates between quitting and restarting.  He uses nicotine vaping to quit tobacco.  Discussed alternative nicotine options that do not involve inhaling.  Encouraged to use nicotine gum, pouch, lozenge, patch instead of vape if possible.   Vasculogenic erectile dysfunction, unspecified vasculogenic erectile dysfunction type Assessment & Plan: Medication working well.  No questions or concerns.  Refill as needed.      Return in about 6 months (around 01/02/2024) for hld.    Sandre Kitty,  MD

## 2023-07-04 NOTE — Assessment & Plan Note (Signed)
Intermittently compliant.  Will increase the dose to 40 mg on the presumption that he will continue to forget to take doses occasionally.  Advised him that 40 mg even every other day would be better than 20 mg every other day and lowering his cholesterol. - Recheck cholesterol panel today - Increase to 40 mg Lipitor

## 2023-07-04 NOTE — Assessment & Plan Note (Signed)
Still alternates between quitting and restarting.  He uses nicotine vaping to quit tobacco.  Discussed alternative nicotine options that do not involve inhaling.  Encouraged to use nicotine gum, pouch, lozenge, patch instead of vape if possible.

## 2023-07-04 NOTE — Patient Instructions (Signed)
It was nice to see you today,  We addressed the following topics today: -We will recheck your cholesterol level today.  Also checking the test for prostate cancer.  We we will check your CBC/platelet levels again as well. - I have increased your dose of Lipitor to 40 mg.  Ideally you will take this daily but try to take it as often as you can remember. - Follow-up with me in 6 months or sooner if needed.  Have a great day,  Frederic Jericho, MD

## 2023-07-04 NOTE — Assessment & Plan Note (Signed)
Tolerating Flomax well.  Has improved his symptoms.  Checking PSA today.

## 2023-07-05 LAB — CBC
Hematocrit: 50.2 % (ref 37.5–51.0)
Hemoglobin: 16.7 g/dL (ref 13.0–17.7)
MCH: 30.8 pg (ref 26.6–33.0)
MCHC: 33.3 g/dL (ref 31.5–35.7)
MCV: 92 fL (ref 79–97)
Platelets: 150 10*3/uL (ref 150–450)
RBC: 5.43 x10E6/uL (ref 4.14–5.80)
RDW: 13.4 % (ref 11.6–15.4)
WBC: 5.3 10*3/uL (ref 3.4–10.8)

## 2023-07-05 LAB — LIPID PANEL
Chol/HDL Ratio: 3.9 {ratio} (ref 0.0–5.0)
Cholesterol, Total: 147 mg/dL (ref 100–199)
HDL: 38 mg/dL — ABNORMAL LOW (ref >39)
LDL Chol Calc (NIH): 91 mg/dL (ref 0–99)
Triglycerides: 97 mg/dL (ref 0–149)
VLDL Cholesterol Cal: 18 mg/dL (ref 5–40)

## 2023-07-05 LAB — PSA: Prostate Specific Ag, Serum: 0.8 ng/mL (ref 0.0–4.0)

## 2023-07-13 DIAGNOSIS — F4321 Adjustment disorder with depressed mood: Secondary | ICD-10-CM | POA: Diagnosis not present

## 2023-07-13 DIAGNOSIS — Z7189 Other specified counseling: Secondary | ICD-10-CM | POA: Diagnosis not present

## 2023-08-07 DIAGNOSIS — G894 Chronic pain syndrome: Secondary | ICD-10-CM | POA: Diagnosis not present

## 2023-08-07 DIAGNOSIS — M47816 Spondylosis without myelopathy or radiculopathy, lumbar region: Secondary | ICD-10-CM | POA: Diagnosis not present

## 2023-08-07 DIAGNOSIS — M62830 Muscle spasm of back: Secondary | ICD-10-CM | POA: Diagnosis not present

## 2023-08-07 DIAGNOSIS — R202 Paresthesia of skin: Secondary | ICD-10-CM | POA: Diagnosis not present

## 2023-08-25 ENCOUNTER — Other Ambulatory Visit: Payer: Self-pay | Admitting: Family Medicine

## 2023-09-04 DIAGNOSIS — R202 Paresthesia of skin: Secondary | ICD-10-CM | POA: Diagnosis not present

## 2023-09-04 DIAGNOSIS — G894 Chronic pain syndrome: Secondary | ICD-10-CM | POA: Diagnosis not present

## 2023-09-04 DIAGNOSIS — M47816 Spondylosis without myelopathy or radiculopathy, lumbar region: Secondary | ICD-10-CM | POA: Diagnosis not present

## 2023-09-04 DIAGNOSIS — M62830 Muscle spasm of back: Secondary | ICD-10-CM | POA: Diagnosis not present

## 2023-09-04 DIAGNOSIS — Z79891 Long term (current) use of opiate analgesic: Secondary | ICD-10-CM | POA: Diagnosis not present

## 2023-10-06 ENCOUNTER — Ambulatory Visit: Payer: Medicaid Other

## 2023-10-09 DIAGNOSIS — R202 Paresthesia of skin: Secondary | ICD-10-CM | POA: Diagnosis not present

## 2023-10-09 DIAGNOSIS — G894 Chronic pain syndrome: Secondary | ICD-10-CM | POA: Diagnosis not present

## 2023-10-09 DIAGNOSIS — Z79891 Long term (current) use of opiate analgesic: Secondary | ICD-10-CM | POA: Diagnosis not present

## 2023-10-09 DIAGNOSIS — M47816 Spondylosis without myelopathy or radiculopathy, lumbar region: Secondary | ICD-10-CM | POA: Diagnosis not present

## 2023-10-09 DIAGNOSIS — M62830 Muscle spasm of back: Secondary | ICD-10-CM | POA: Diagnosis not present

## 2023-10-11 ENCOUNTER — Ambulatory Visit (INDEPENDENT_AMBULATORY_CARE_PROVIDER_SITE_OTHER): Payer: Medicaid Other

## 2023-10-11 ENCOUNTER — Ambulatory Visit
Admission: RE | Admit: 2023-10-11 | Discharge: 2023-10-11 | Disposition: A | Discharge status: Home / Self Care | Payer: Medicaid Other | Source: Ambulatory Visit

## 2023-10-11 DIAGNOSIS — J22 Unspecified acute lower respiratory infection: Secondary | ICD-10-CM

## 2023-10-11 DIAGNOSIS — R0602 Shortness of breath: Secondary | ICD-10-CM | POA: Diagnosis not present

## 2023-10-11 DIAGNOSIS — R059 Cough, unspecified: Secondary | ICD-10-CM | POA: Diagnosis not present

## 2023-10-11 MED ORDER — PREDNISONE 20 MG PO TABS: 20.0000 mg | ORAL_TABLET | Freq: Every day | ORAL | 0 refills | Status: AC

## 2023-10-11 MED ORDER — DOXYCYCLINE HYCLATE 100 MG PO CAPS: 100.0000 mg | ORAL_CAPSULE | Freq: Two times a day (BID) | ORAL | 0 refills | Status: DC

## 2023-10-11 MED ORDER — PROMETHAZINE-DM 6.25-15 MG/5ML PO SYRP: 5.0000 mL | ORAL_SOLUTION | Freq: Three times a day (TID) | ORAL | 0 refills | Status: DC | PRN

## 2023-10-11 NOTE — ED Triage Notes (Signed)
"  This cough started about 3 wks ago". No visits since this started. No asthma. No COPD. "I am now sob and concerned with walking Pna". No fever known (at all). "Cough is now non productive".

## 2023-10-11 NOTE — Discharge Instructions (Addendum)
Chest x-ray was negative for pneumonia as we discussed I am treating you for a lower respiratory infection with doxycycline twice daily for 10 days and prednisone 20 mg daily for 5 days.

## 2023-10-11 NOTE — ED Provider Notes (Signed)
[facility]    CSN: [id number] Arrival date & time: 10/11/23  1349      History   Chief Complaint Chief Complaint  Patient presents with   Cough    HPI Zachary Leon is a 53 y.o. male.  Patient is here for evaluation of cough x 3 weeks. He endorses shortness of breath. He has not history of asthma or COPD. Of note patient is a daily smoker. No fever , sore throat or body aches. Past Medical History:  Diagnosis Date   Blood in stool    History of chicken pox    History of gastroesophageal reflux (GERD)    History of meningitis    at age 37   History of pneumococcal pneumonia    at age 37   Hyperlipidemia    Memory loss 11/13/2013    Patient Active Problem List   Diagnosis Date Noted   Erectile dysfunction 03/07/2023   Urinary retention 03/07/2023   Chronic pain 12/06/2022   Lipoma 12/06/2022   Thrombocytopenia (HCC) 12/06/2022   Hyperlipidemia 11/22/2013   Constipation, chronic 11/14/2013   Tobacco use disorder 11/13/2013   GERD (gastroesophageal reflux disease) 11/13/2013   UNSPECIFIED HEARING LOSS 07/30/2008   ANXIETY 04/03/2008   PTSD (post-traumatic stress disorder) 04/03/2008   Pain in limb 04/03/2008    Past Surgical History:  Procedure Laterality Date   SPINE SURGERY         Home Medications    Prior to Admission medications   Medication Sig Start Date End Date Taking? Authorizing Provider  doxycycline (VIBRAMYCIN) 100 MG capsule Take 1 capsule (100 mg total) by mouth 2 (two) times daily. 10/11/23  Yes Bing Neighbors, NP  Oxycodone HCl 10 MG TABS Take 1 tablet by mouth. One tablet by mouth 3 to 4 times daily as needed 09/06/22  Yes [provider]  predniSONE (DELTASONE) 20 MG tablet Take 1 tablet (20 mg total) by mouth daily with breakfast for 5 days. 10/11/23 10/16/23 Yes Bing Neighbors, NP  promethazine-dextromethorphan (PROMETHAZINE-DM) 6.25-15 MG/5ML syrup Take 5 mLs by mouth 3 (three) times daily as needed for  cough. 10/11/23  Yes Bing Neighbors, NP  sildenafil (VIAGRA) 50 MG tablet TAKE 1 TABLET (50 MG TOTAL) BY MOUTH DAILY AS NEEDED FOR ERECTILE DYSFUNCTION. 08/28/23  Yes Sandre Kitty, MD  atorvastatin (LIPITOR) 40 MG tablet Take 1 tablet (40 mg total) by mouth daily. 07/04/23   Sandre Kitty, MD  gabapentin (NEURONTIN) 300 MG capsule Take 300 mg by mouth 3 (three) times daily. 07/19/22   [provider]  linaclotide Karlene Einstein) 72 MCG capsule Take 1 capsule (72 mcg total) by mouth daily before breakfast. 12/07/22   Sandre Kitty, MD  omeprazole (PRILOSEC) 20 MG capsule Take 1 capsule (20 mg total) by mouth daily. 01/11/16   Doreene Nest, NP  tamsulosin (FLOMAX) 0.4 MG CAPS capsule Take 1 capsule (0.4 mg total) by mouth daily. 03/07/23   Sandre Kitty, MD    Family History Family History  Problem Relation Age of Onset   Brain cancer Father        craniopharyngioma (? pituitary gland or near it)    Social History Social History   Tobacco Use   Smoking status: Every Day    Current packs/day: 1.00    Average packs/day: 1 pack/day for 35.0 years (35.0 ttl pk-yrs)    Types: Cigarettes    Passive exposure: Never  Vaping Use   Vaping status: Some  Days   Substances: Nicotine, Flavoring  Substance Use Topics   Alcohol use: Yes    Comment: Socially.   Drug use: No     Allergies   Patient has no known allergies.   Review of Systems Review of Systems  Respiratory:  Positive for cough.      Physical Exam Triage Vital Signs ED Triage Vitals  Encounter Vitals Group     BP 10/11/23 1356 115/75     Systolic BP Percentile --      Diastolic BP Percentile --      Pulse Rate 10/11/23 1356 68     Resp 10/11/23 1356 18     Temp 10/11/23 1356 98.4 F (36.9 C)     Temp Source 10/11/23 1356 Oral     SpO2 10/11/23 1356 99 %     Weight 10/11/23 1359 200 lb (90.7 kg)     Height 10/11/23 1359 5\' 11"  (1.803 m)     Head Circumference --      Peak Flow --      Pain Score  --      Pain Loc --      Pain Education --      Exclude from Growth Chart --    No data found.  Updated Vital Signs BP 115/75 (BP Location: Right Arm)   Pulse 68   Temp 98.4 F (36.9 C) (Oral)   Resp 18   Ht 5\' 11"  (1.803 m)   Wt 200 lb (90.7 kg)   SpO2 100%   BMI 27.89 kg/m   Visual Acuity Right Eye Distance:   Left Eye Distance:   Bilateral Distance:    Right Eye Near:   Left Eye Near:    Bilateral Near:     Physical Exam   UC Treatments / Results  Labs (all labs ordered are listed, but only abnormal results are displayed) Labs Reviewed - No data to display  EKG   Radiology No results found.  Procedures Procedures (including critical care time)  Medications Ordered in UC Medications - No data to display  Initial Impression / Assessment and Plan / UC Course  I have reviewed the triage vital signs and the nursing notes.  Pertinent labs & imaging results that were available during my care of the patient were reviewed by me and considered in my medical decision making (see chart for details).    Acute Lower Respiratory Infection, chest x-ray negative for pneumonia. Treating for presumed infectious bacterial respiratory infection . Treatment per discharge medication orders. Return precautions given if symptoms worsen or do not improve.  1. Acute lower respiratory infection (Primary) - DG Chest 2 View - doxycycline (VIBRAMYCIN) 100 MG capsule; Take 1 capsule (100 mg total) by mouth 2 (two) times daily.  Dispense: 20 capsule; Refill: 0 - predniSONE (DELTASONE) 20 MG tablet; Take 1 tablet (20 mg total) by mouth daily with breakfast for 5 days.  Dispense: 5 tablet; Refill: 0 - promethazine-dextromethorphan (PROMETHAZINE-DM) 6.25-15 MG/5ML syrup; Take 5 mLs by mouth 3 (three) times daily as needed for cough.  Dispense: 240 mL; Refill: 0    Final Clinical Impressions(s) / UC Diagnoses   Final diagnoses:  Acute lower respiratory infection     Discharge  Instructions      Chest x-ray was negative for pneumonia as we discussed I am treating you for a lower respiratory infection with doxycycline twice daily for 10 days and prednisone 20 mg daily for 5 days.     ED Prescriptions  Medication Sig Dispense Auth. Provider   doxycycline (VIBRAMYCIN) 100 MG capsule Take 1 capsule (100 mg total) by mouth 2 (two) times daily. 20 capsule Bing Neighbors, NP   predniSONE (DELTASONE) 20 MG tablet Take 1 tablet (20 mg total) by mouth daily with breakfast for 5 days. 5 tablet Bing Neighbors, NP   promethazine-dextromethorphan (PROMETHAZINE-DM) 6.25-15 MG/5ML syrup Take 5 mLs by mouth 3 (three) times daily as needed for cough. 240 mL Bing Neighbors, NP      PDMP not reviewed this encounter.   Bing Neighbors, NP 10/14/23 1116

## 2023-10-24 ENCOUNTER — Ambulatory Visit: Payer: Medicaid Other | Admitting: Family Medicine

## 2023-11-06 ENCOUNTER — Ambulatory Visit: Payer: Medicaid Other | Admitting: Family Medicine

## 2023-11-06 DIAGNOSIS — Z79891 Long term (current) use of opiate analgesic: Secondary | ICD-10-CM | POA: Diagnosis not present

## 2023-11-06 DIAGNOSIS — R202 Paresthesia of skin: Secondary | ICD-10-CM | POA: Diagnosis not present

## 2023-11-06 DIAGNOSIS — M47816 Spondylosis without myelopathy or radiculopathy, lumbar region: Secondary | ICD-10-CM | POA: Diagnosis not present

## 2023-11-06 DIAGNOSIS — G894 Chronic pain syndrome: Secondary | ICD-10-CM | POA: Diagnosis not present

## 2023-11-06 DIAGNOSIS — M62830 Muscle spasm of back: Secondary | ICD-10-CM | POA: Diagnosis not present

## 2023-11-15 ENCOUNTER — Other Ambulatory Visit: Payer: Self-pay | Admitting: Family Medicine

## 2023-11-25 ENCOUNTER — Other Ambulatory Visit: Payer: Self-pay

## 2023-11-25 ENCOUNTER — Ambulatory Visit
Admission: EM | Admit: 2023-11-25 | Discharge: 2023-11-25 | Disposition: A | Discharge status: Home / Self Care | Attending: Family Medicine | Admitting: Family Medicine

## 2023-11-25 DIAGNOSIS — L02214 Cutaneous abscess of groin: Secondary | ICD-10-CM | POA: Diagnosis not present

## 2023-11-25 MED ORDER — DOXYCYCLINE HYCLATE 100 MG PO CAPS: 100.0000 mg | ORAL_CAPSULE | Freq: Two times a day (BID) | ORAL | 0 refills | Status: AC

## 2023-11-25 NOTE — ED Provider Notes (Signed)
 [facility]    CSN: [id number] Arrival date & time: 11/25/23  1500      History   Chief Complaint Chief Complaint  Patient presents with   Skin Problem    HPI Zachary Leon is a 53 y.o. male.   HPI Suspected MRSA infection, states has had a small painful lump left inguinal region x 1-1/2 weeks, symptoms felt worse today.  Has had MRSA infection in the past that required incision and drainage.  He denies fever, chills, testicular or scrotal pain or swelling, body aches or fatigue, drainage from area, redness, enlarged inguinal nodes.  No known drug allergies.  Past Medical History:  Diagnosis Date   Blood in stool    History of chicken pox    History of gastroesophageal reflux (GERD)    History of meningitis    at age 89   History of pneumococcal pneumonia    at age 89   Hyperlipidemia    Memory loss 11/13/2013    Patient Active Problem List   Diagnosis Date Noted   Erectile dysfunction 03/07/2023   Urinary retention 03/07/2023   Chronic pain 12/06/2022   Lipoma 12/06/2022   Thrombocytopenia (HCC) 12/06/2022   Hyperlipidemia 11/22/2013   Constipation, chronic 11/14/2013   Tobacco use disorder 11/13/2013   GERD (gastroesophageal reflux disease) 11/13/2013   UNSPECIFIED HEARING LOSS 07/30/2008   ANXIETY 04/03/2008   PTSD (post-traumatic stress disorder) 04/03/2008   Pain in limb 04/03/2008    Past Surgical History:  Procedure Laterality Date   SPINE SURGERY         Home Medications    Prior to Admission medications   Medication Sig Start Date End Date Taking? Authorizing Provider  doxycycline (VIBRAMYCIN) 100 MG capsule Take 1 capsule (100 mg total) by mouth 2 (two) times daily for 7 days. 11/25/23 12/02/23 Yes Meliton Rattan, PA  atorvastatin (LIPITOR) 40 MG tablet Take 1 tablet (40 mg total) by mouth daily. 07/04/23   Sandre Kitty, MD  gabapentin (NEURONTIN) 300 MG capsule Take 300 mg by mouth 3 (three) times daily. 07/19/22   [provider]  linaclotide Karlene Einstein) 72 MCG capsule Take 1 capsule (72 mcg total) by mouth daily before breakfast. 12/07/22   Sandre Kitty, MD  omeprazole (PRILOSEC) 20 MG capsule Take 1 capsule (20 mg total) by mouth daily. 01/11/16   Doreene Nest, NP  sildenafil (VIAGRA) 50 MG tablet TAKE 1 TABLET (50 MG TOTAL) BY MOUTH DAILY AS NEEDED FOR ERECTILE DYSFUNCTION. 11/15/23   Sandre Kitty, MD  tamsulosin (FLOMAX) 0.4 MG CAPS capsule Take 1 capsule (0.4 mg total) by mouth daily. 03/07/23   Sandre Kitty, MD    Family History Family History  Problem Relation Age of Onset   Brain cancer Father        craniopharyngioma (? pituitary gland or near it)    Social History Social History   Tobacco Use   Smoking status: Every Day    Current packs/day: 1.00    Average packs/day: 1 pack/day for 35.0 years (35.0 ttl pk-yrs)    Types: Cigarettes    Passive exposure: Never  Vaping Use   Vaping status: Some Days   Substances: Nicotine, Flavoring  Substance Use Topics   Alcohol use: Yes    Comment: Socially.   Drug use: No     Allergies   Patient has no known allergies.   Review of Systems Review of Systems   Physical Exam Triage Vital Signs ED  Triage Vitals  Encounter Vitals Group     BP 11/25/23 1508 123/80     Systolic BP Percentile --      Diastolic BP Percentile --      Pulse Rate 11/25/23 1508 74     Resp 11/25/23 1508 16     Temp 11/25/23 1508 97.9 F (36.6 C)     Temp Source 11/25/23 1508 Oral     SpO2 11/25/23 1508 97 %     Weight 11/25/23 1508 196 lb (88.9 kg)     Height 11/25/23 1508 5\' 11"  (1.803 m)     Head Circumference --      Peak Flow --      Pain Score 11/25/23 1517 1     Pain Loc --      Pain Education --      Exclude from Growth Chart --    No data found.  Updated Vital Signs BP 123/80 (BP Location: Right Arm)   Pulse 74   Temp 97.9 F (36.6 C) (Oral)   Resp 16   Ht 5\' 11"  (1.803 m)   Wt 196 lb (88.9 kg)   SpO2 97%   BMI 27.34 kg/m    Visual Acuity Right Eye Distance:   Left Eye Distance:   Bilateral Distance:    Right Eye Near:   Left Eye Near:    Bilateral Near:     Physical Exam Constitutional:      Appearance: Normal appearance.  HENT:     Head: Normocephalic.  Cardiovascular:     Rate and Rhythm: Normal rate.  Pulmonary:     Effort: Pulmonary effort is normal. No respiratory distress.  Skin:    General: Skin is warm and dry.     Findings: Lesion present. No erythema.     Comments: 1 x 1.5 cm indurated region left inguinal fold without overlying erythema or warmth, area is not fluctuant, does not involve the scrotum, no inguinal adenopathy, no active drainage, mild tenderness  Neurological:     Mental Status: He is alert.      UC Treatments / Results  Labs (all labs ordered are listed, but only abnormal results are displayed) Labs Reviewed - No data to display  EKG   Radiology No results found.  Procedures Procedures (including critical care time)  Medications Ordered in UC Medications - No data to display  Initial Impression / Assessment and Plan / UC Course  I have reviewed the triage vital signs and the nursing notes.  Pertinent labs & imaging results that were available during my care of the patient were reviewed by me and considered in my medical decision making (see chart for details).     53 year old male with prior history of MRSA presents with skin abscess left inguinal fold, lesion does not appear ready to be drained, there are no systemic symptoms no evidence of Fournier's syndrome.  Will start on doxycycline recommend warm compresses, strict follow-up and ED precautions reviewed with patient Final Clinical Impressions(s) / UC Diagnoses   Final diagnoses:  Abscess of left groin     Discharge Instructions      Warm compresses to affected area Check by your doctor in 48 hours Go to the emergency department for significant worsening of symptoms including increased  swelling, increased pain, development of fever, symptoms spreading to scrotum or testicles, flulike symptoms   ED Prescriptions     Medication Sig Dispense Auth. Provider   doxycycline (VIBRAMYCIN) 100 MG capsule Take 1 capsule (  100 mg total) by mouth 2 (two) times daily for 7 days. 14 capsule Meliton Rattan, [place]      PDMP not reviewed this encounter.   Meliton Rattan, [place] 11/25/23 [phone]

## 2023-11-25 NOTE — Discharge Instructions (Signed)
 Warm compresses to affected area Check by your doctor in 48 hours Go to the emergency department for significant worsening of symptoms including increased swelling, increased pain, development of fever, symptoms spreading to scrotum or testicles, flulike symptoms

## 2023-11-25 NOTE — ED Triage Notes (Signed)
 Pt presents with complaints of skin problem located on left leg next to testicles x 1.5 weeks. Pt currently rates his overall pain a 1/10. Denies taking or applying OTC medications.

## 2023-12-02 ENCOUNTER — Encounter: Payer: Self-pay | Admitting: Family Medicine

## 2023-12-04 ENCOUNTER — Other Ambulatory Visit: Payer: Self-pay

## 2023-12-04 DIAGNOSIS — E782 Mixed hyperlipidemia: Secondary | ICD-10-CM

## 2023-12-04 DIAGNOSIS — R7989 Other specified abnormal findings of blood chemistry: Secondary | ICD-10-CM

## 2023-12-26 ENCOUNTER — Ambulatory Visit
Admission: RE | Admit: 2023-12-26 | Discharge: 2023-12-26 | Disposition: A | Discharge status: Home / Self Care | Source: Ambulatory Visit | Attending: Family Medicine | Admitting: Family Medicine

## 2023-12-26 ENCOUNTER — Ambulatory Visit (INDEPENDENT_AMBULATORY_CARE_PROVIDER_SITE_OTHER): Admitting: Radiology

## 2023-12-26 VITALS — BP 144/83 | HR 63 | Temp 97.9°F | Resp 18

## 2023-12-26 DIAGNOSIS — T849XXA Unspecified complication of internal orthopedic prosthetic device, implant and graft, initial encounter: Secondary | ICD-10-CM | POA: Diagnosis not present

## 2023-12-26 DIAGNOSIS — T84193A Other mechanical complication of internal fixation device of bone of left forearm, initial encounter: Secondary | ICD-10-CM
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
## 2023-12-27 ENCOUNTER — Other Ambulatory Visit: Payer: Medicaid Other

## 2023-12-27 ENCOUNTER — Telehealth: Payer: Self-pay

## 2023-12-27 DIAGNOSIS — E782 Mixed hyperlipidemia: Secondary | ICD-10-CM | POA: Diagnosis not present

## 2023-12-27 DIAGNOSIS — R7989 Other specified abnormal findings of blood chemistry: Secondary | ICD-10-CM

## 2023-12-27 NOTE — Telephone Encounter (Signed)
 While pt was here getting labs for upcoming appt. Pt asked if he could get a new referral to go back and see the surgeon that completed a previous surgery in 2020.  I called they stated that he could call and make an appt. Pt states he still needs an referral for insurance purposes.   [facility]  [address] [place] [address]  Cecile Sheerer, MD   [phone] (Work)  [phone] (Fax)

## 2023-12-28 ENCOUNTER — Other Ambulatory Visit: Payer: Self-pay | Admitting: Family Medicine

## 2023-12-28 DIAGNOSIS — S52602S Unspecified fracture of lower end of left ulna, sequela: Secondary | ICD-10-CM

## 2023-12-28 LAB — TESTOSTERONE: Testosterone: 976 ng/dL — ABNORMAL HIGH (ref 264–916)

## 2023-12-28 LAB — LIPID PANEL
Chol/HDL Ratio: 3.4 ratio (ref 0.0–5.0)
Cholesterol, Total: 135 mg/dL (ref 100–199)
HDL: 40 mg/dL (ref >39)
LDL Chol Calc (NIH): 82 mg/dL (ref 0–99)
Triglycerides: 62 mg/dL (ref 0–149)
VLDL Cholesterol Cal: 13 mg/dL (ref 5–40)

## 2023-12-28 NOTE — Telephone Encounter (Signed)
 Order placed

## 2024-01-02 ENCOUNTER — Encounter: Payer: Self-pay | Admitting: Family Medicine

## 2024-01-02 ENCOUNTER — Other Ambulatory Visit: Payer: Self-pay | Admitting: Family Medicine

## 2024-01-02 ENCOUNTER — Ambulatory Visit (INDEPENDENT_AMBULATORY_CARE_PROVIDER_SITE_OTHER): Payer: Medicaid Other | Admitting: Family Medicine

## 2024-01-02 VITALS — BP 117/66 | HR 70 | Ht 71.0 in | Wt 186.0 lb

## 2024-01-02 DIAGNOSIS — R339 Retention of urine, unspecified: Secondary | ICD-10-CM

## 2024-01-02 DIAGNOSIS — E663 Overweight: Secondary | ICD-10-CM | POA: Diagnosis not present

## 2024-01-02 DIAGNOSIS — N529 Male erectile dysfunction, unspecified: Secondary | ICD-10-CM | POA: Diagnosis not present

## 2024-01-02 DIAGNOSIS — E782 Mixed hyperlipidemia: Secondary | ICD-10-CM | POA: Diagnosis not present

## 2024-01-02 DIAGNOSIS — D696 Thrombocytopenia, unspecified: Secondary | ICD-10-CM | POA: Diagnosis not present

## 2024-01-02 MED ORDER — SILDENAFIL CITRATE 50 MG PO TABS: 50.0000 mg | ORAL_TABLET | Freq: Every day | ORAL | 2 refills | Status: DC | PRN

## 2024-01-02 NOTE — Assessment & Plan Note (Signed)
 We discussed the reason the patient is taking the tamsulosin.  He has stopped that recently.  Noticed an increase in urination frequency after stopping it.  Advised him he can continue this medication for symptomatic relief of BPH.

## 2024-01-02 NOTE — Assessment & Plan Note (Signed)
 Patient is taking a compounded tirzepatide that he gets from an online provider.  He does not know the dose, his wife who has been already using it administers the medication to him.  He has not had any significant side effects other than some 1 day nausea.  He is unlikely to qualify for prescription of this through Medicaid otherwise we could prescribe it for him.

## 2024-01-02 NOTE — Assessment & Plan Note (Signed)
 Patient's last reading was low normal.  Will recheck today.

## 2024-01-02 NOTE — Assessment & Plan Note (Signed)
 Under control.  No side effects.  Continue 40 mg understanding that patient will continue to miss doses multiple times per week recheck lipid panel in 6 months

## 2024-01-02 NOTE — Assessment & Plan Note (Signed)
 Refilling Viagra today.

## 2024-01-02 NOTE — Patient Instructions (Addendum)
 It was nice to see you today,  We addressed the following topics today: -I am rechecking your CBC and CMP today. - In 6 months we will do your physical and recheck your labs before - I will send in your prescription today - No changes to your medications - Your testosterone level being slightly elevated is not concerning.  It is likely related to the over-the-counter supplements you take. - Your Flomax is to help with the urinary symptoms caused by BPH.  If you are still experiencing the symptoms you can continue taking the Flomax.  Have a great day,  Etha Henle, MD
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
## 2024-01-03 ENCOUNTER — Encounter: Payer: Self-pay | Admitting: Family Medicine

## 2024-01-03 LAB — COMPREHENSIVE METABOLIC PANEL WITH GFR
ALT: 25 IU/L (ref 0–44)
AST: 20 IU/L (ref 0–40)
Albumin: 4.5 g/dL (ref 3.8–4.9)
Alkaline Phosphatase: 81 IU/L (ref 44–121)
BUN/Creatinine Ratio: 20 (ref 9–20)
BUN: 17 mg/dL (ref 6–24)
Bilirubin Total: 0.4 mg/dL (ref 0.0–1.2)
CO2: 20 mmol/L (ref 20–29)
Calcium: 9.4 mg/dL (ref 8.7–10.2)
Chloride: 104 mmol/L (ref 96–106)
Creatinine, Ser: 0.83 mg/dL (ref 0.76–1.27)
Globulin, Total: 1.9 g/dL (ref 1.5–4.5)
Glucose: 99 mg/dL (ref 70–99)
Potassium: 4.2 mmol/L (ref 3.5–5.2)
Sodium: 138 mmol/L (ref 134–144)
Total Protein: 6.4 g/dL (ref 6.0–8.5)
eGFR: 105 mL/min/{1.73_m2} (ref >59)

## 2024-01-03 LAB — CBC
Hematocrit: 46.7 % (ref 37.5–51.0)
Hemoglobin: 16 g/dL (ref 13.0–17.7)
MCH: 30.9 pg (ref 26.6–33.0)
MCHC: 34.3 g/dL (ref 31.5–35.7)
MCV: 90 fL (ref 79–97)
Platelets: 142 10*3/uL — ABNORMAL LOW (ref 150–450)
RBC: 5.17 x10E6/uL (ref 4.14–5.80)
RDW: 13.6 % (ref 11.6–15.4)
WBC: 6.3 10*3/uL (ref 3.4–10.8)

## 2024-01-17 DIAGNOSIS — S52202D Unspecified fracture of shaft of left ulna, subsequent encounter for closed fracture with routine healing: Secondary | ICD-10-CM | POA: Diagnosis not present

## 2024-01-17 DIAGNOSIS — S5292XD Unspecified fracture of left forearm, subsequent encounter for closed fracture with routine healing: Secondary | ICD-10-CM | POA: Diagnosis not present

## 2024-01-26 ENCOUNTER — Ambulatory Visit
Admission: RE | Admit: 2024-01-26 | Discharge: 2024-01-26 | Disposition: A | Discharge status: Home / Self Care | Source: Ambulatory Visit | Attending: Emergency Medicine | Admitting: Emergency Medicine

## 2024-01-26 VITALS — BP 119/80 | HR 70 | Temp 97.5°F | Resp 18

## 2024-01-26 DIAGNOSIS — R42 Dizziness and giddiness: Secondary | ICD-10-CM | POA: Diagnosis not present

## 2024-01-26 LAB — POCT FASTING CBG KUC MANUAL ENTRY: POCT Glucose (KUC): 104 mg/dL — AB (ref 70–99)

## 2024-01-26 MED ORDER — MECLIZINE HCL 25 MG PO TABS: 25.0000 mg | ORAL_TABLET | Freq: Three times a day (TID) | ORAL | 0 refills | Status: AC | PRN

## 2024-01-26 NOTE — ED Triage Notes (Addendum)
 Pt c/o dizziness for 3 weeks. Dizziness is worse when he turns head.   He is taking PT141 and Tirzepatide. States about 4 weeks ago his wife got the two medications mixed up and he got 5mg  of PT141 when usually takes 1.5mg 

## 2024-01-26 NOTE — ED Provider Notes (Signed)
 [facility]    CSN: [id number] Arrival date & time: 01/26/24  1502      History   Chief Complaint Chief Complaint  Patient presents with   Dizziness    I have been feeling dizzy the last 3 weeks. Time to get checked out - Entered by patient    HPI Zachary Leon is a 52 y.o. male.   Patient presents to clinic over concerns of dizziness that has been ongoing for the past 3 weeks.  Notices that the dizziness is throughout the day but does get worse when he lays down or turns his head to the side.   Has not tried any medications or interventions for her symptoms.  He is on tries appetite and has lost around 25 pounds over the past few months.  Has not had any vomiting, did have 1 episode of nausea.  Denies numbness, tingling, weakness, blurred vision, headache or speech changes.   The history is provided by the patient and medical records.  Dizziness   Past Medical History:  Diagnosis Date   Blood in stool    History of chicken pox    History of gastroesophageal reflux (GERD)    History of meningitis    at age 82   History of pneumococcal pneumonia    at age 82   Hyperlipidemia    Memory loss 11/13/2013    Patient Active Problem List   Diagnosis Date Noted   Overweight 01/02/2024   Erectile dysfunction 03/07/2023   Urinary retention 03/07/2023   Chronic pain 12/06/2022   Lipoma 12/06/2022   Thrombocytopenia (HCC) 12/06/2022   Hyperlipidemia 11/22/2013   Constipation, chronic 11/14/2013   Tobacco use disorder 11/13/2013   GERD (gastroesophageal reflux disease) 11/13/2013   UNSPECIFIED HEARING LOSS 07/30/2008   ANXIETY 04/03/2008   PTSD (post-traumatic stress disorder) 04/03/2008   Pain in limb 04/03/2008    Past Surgical History:  Procedure Laterality Date   SPINE SURGERY         Home Medications    Prior to Admission medications   Medication Sig Start Date End Date Taking? Authorizing Provider  Bremelanotide Acetate 1.75 MG/0.3ML SOAJ  Inject 1.5 mg into the skin. PT141   Yes [provider]  meclizine (ANTIVERT) 25 MG tablet Take 1 tablet (25 mg total) by mouth 3 (three) times daily as needed for dizziness. 01/26/24  Yes Emalia Witkop  N, FNP  tirzepatide Florence Hunt) 5 MG/0.5ML Pen Inject 5 mg into the skin once a week.   Yes [provider]  atorvastatin  (LIPITOR) 20 MG tablet Take 20 mg by mouth daily. 10/09/23   [provider]  atorvastatin  (LIPITOR) 40 MG tablet Take 1 tablet (40 mg total) by mouth daily. 07/04/23   Laneta Pintos, MD  LINZESS  72 MCG capsule TAKE 1 CAPSULE (72 MCG TOTAL) BY MOUTH DAILY BEFORE BREAKFAST. 01/02/24   Laneta Pintos, MD  omeprazole  (PRILOSEC) 20 MG capsule Take 1 capsule (20 mg total) by mouth daily. 01/11/16   Clark, Katherine K, NP  sildenafil  (VIAGRA ) 50 MG tablet Take 1 tablet (50 mg total) by mouth daily as needed for erectile dysfunction. 01/02/24   Laneta Pintos, MD  tamsulosin  (FLOMAX ) 0.4 MG CAPS capsule Take 1 capsule (0.4 mg total) by mouth daily. 03/07/23   Laneta Pintos, MD    Family History Family History  Problem Relation Age of Onset   Brain cancer Father        craniopharyngioma (? pituitary gland or near  it)    Social History Social History   Tobacco Use   Smoking status: Every Day    Current packs/day: 1.00    Average packs/day: 1 pack/day for 35.0 years (35.0 ttl pk-yrs)    Types: Cigarettes    Passive exposure: Never  Vaping Use   Vaping status: Some Days   Substances: Nicotine, Flavoring  Substance Use Topics   Alcohol use: Yes    Comment: Socially.   Drug use: No     Allergies   Patient has no known allergies.   Review of Systems Review of Systems  Per HPI  Physical Exam Triage Vital Signs ED Triage Vitals [01/26/24 1514]  Encounter Vitals Group     BP 119/80     Systolic BP Percentile      Diastolic BP Percentile      Pulse Rate 70     Resp 18     Temp (!) 97.5 F (36.4 C)     Temp Source Oral     SpO2  97 %     Weight      Height      Head Circumference      Peak Flow      Pain Score      Pain Loc      Pain Education      Exclude from Growth Chart    No data found.  Updated Vital Signs BP 119/80 (BP Location: Right Arm)   Pulse 70   Temp (!) 97.5 F (36.4 C) (Oral)   Resp 18   SpO2 97%   Visual Acuity Right Eye Distance:   Left Eye Distance:   Bilateral Distance:    Right Eye Near:   Left Eye Near:    Bilateral Near:     Physical Exam Vitals and nursing note reviewed.  Constitutional:      Appearance: Normal appearance.  HENT:     Head: Normocephalic and atraumatic.     Right Ear: Tympanic membrane, ear canal and external ear normal.     Left Ear: Tympanic membrane, ear canal and external ear normal.     Nose: Nose normal.     Mouth/Throat:     Mouth: Mucous membranes are moist.  Eyes:     General: No scleral icterus.    Extraocular Movements: Extraocular movements intact.     Conjunctiva/sclera: Conjunctivae normal.     Pupils: Pupils are equal, round, and reactive to light.  Cardiovascular:     Rate and Rhythm: Normal rate.  Pulmonary:     Effort: Pulmonary effort is normal. No respiratory distress.  Skin:    General: Skin is warm and dry.  Neurological:     General: No focal deficit present.     Mental Status: He is alert.  Psychiatric:        Mood and Affect: Mood normal.      UC Treatments / Results  Labs (all labs ordered are listed, but only abnormal results are displayed) Labs Reviewed  POCT FASTING CBG [facility] MANUAL ENTRY - Abnormal; Notable for the following components:      Result Value   POCT Glucose ([facility]) 104 (*)    All other components within normal limits    EKG   Radiology No results found.  Procedures Procedures (including critical care time)  Medications Ordered in UC Medications - No data to display  Initial Impression / Assessment and Plan / UC Course  I have reviewed the triage vital signs and the  nursing  notes.  Pertinent labs & imaging results that were available during my care of the patient were reviewed by me and considered in my medical decision making (see chart for details).  Vitals in triage reviewed, patient is hemodynamically stable.  Tympanic membrane pearly gray with a good cone of light bilaterally.  PERRLA, extraocular movements intact.  Suspect vertigo due to dizziness with laying down and head movement.  Will trial meclizine.  ENT follow-up if symptoms persist.  Strict emergency precautions given if symptoms evolve or change in any way.  Patient verbalized understanding, no questions at this time.     Final Clinical Impressions(s) / UC Diagnoses   Final diagnoses:  Vertigo     Discharge Instructions      Your symptoms are consistent with vertigo.  Take the meclizine up to 3 times daily as needed for dizziness.  Take your time with position changes.  If this persist, please follow-up with an ENT for further evaluation.  Seek immediate care for any blurred vision, loss of consciousness, or new concerning symptoms.  ED Prescriptions     Medication Sig Dispense Auth. Provider   meclizine (ANTIVERT) 25 MG tablet Take 1 tablet (25 mg total) by mouth 3 (three) times daily as needed for dizziness. 30 tablet Harlow Lighter, Gregor Dershem  N, FNP      PDMP not reviewed this encounter.   Harlow Lighter, Saquan Furtick  N, FNP 01/26/24 1541

## 2024-01-26 NOTE — Discharge Instructions (Signed)
 Your symptoms are consistent with vertigo.  Take the meclizine up to 3 times daily as needed for dizziness.  Take your time with position changes.  If this persist, please follow-up with an ENT for further evaluation.  Seek immediate care for any blurred vision, loss of consciousness, or new concerning symptoms.

## 2024-03-04 DIAGNOSIS — M62830 Muscle spasm of back: Secondary | ICD-10-CM | POA: Diagnosis not present

## 2024-03-04 DIAGNOSIS — Z79891 Long term (current) use of opiate analgesic: Secondary | ICD-10-CM | POA: Diagnosis not present

## 2024-03-04 DIAGNOSIS — R202 Paresthesia of skin: Secondary | ICD-10-CM | POA: Diagnosis not present

## 2024-03-04 DIAGNOSIS — M47816 Spondylosis without myelopathy or radiculopathy, lumbar region: Secondary | ICD-10-CM | POA: Diagnosis not present

## 2024-03-04 DIAGNOSIS — G894 Chronic pain syndrome: Secondary | ICD-10-CM | POA: Diagnosis not present

## 2024-05-06 ENCOUNTER — Other Ambulatory Visit: Payer: Self-pay | Admitting: Family Medicine

## 2024-07-07 ENCOUNTER — Other Ambulatory Visit: Payer: Self-pay | Admitting: Family Medicine

## 2024-07-07 DIAGNOSIS — D696 Thrombocytopenia, unspecified: Secondary | ICD-10-CM

## 2024-07-07 DIAGNOSIS — E782 Mixed hyperlipidemia: Secondary | ICD-10-CM

## 2024-07-07 DIAGNOSIS — F17200 Nicotine dependence, unspecified, uncomplicated: Secondary | ICD-10-CM

## 2024-07-07 DIAGNOSIS — E663 Overweight: Secondary | ICD-10-CM

## 2024-07-08 ENCOUNTER — Other Ambulatory Visit: Payer: PRIVATE HEALTH INSURANCE

## 2024-07-08 DIAGNOSIS — E782 Mixed hyperlipidemia: Secondary | ICD-10-CM

## 2024-07-08 DIAGNOSIS — D696 Thrombocytopenia, unspecified: Secondary | ICD-10-CM

## 2024-07-08 DIAGNOSIS — E663 Overweight: Secondary | ICD-10-CM

## 2024-07-09 LAB — CBC WITH DIFFERENTIAL/PLATELET
Basophils Absolute: 0.1 x10E3/uL (ref 0.0–0.2)
Basos: 1 %
EOS (ABSOLUTE): 0.1 x10E3/uL (ref 0.0–0.4)
Eos: 1 %
Hematocrit: 47.1 % (ref 37.5–51.0)
Hemoglobin: 15.9 g/dL (ref 13.0–17.7)
Immature Grans (Abs): 0 x10E3/uL (ref 0.0–0.1)
Immature Granulocytes: 0 %
Lymphocytes Absolute: 1.6 x10E3/uL (ref 0.7–3.1)
Lymphs: 28 %
MCH: 31.1 pg (ref 26.6–33.0)
MCHC: 33.8 g/dL (ref 31.5–35.7)
MCV: 92 fL (ref 79–97)
Monocytes Absolute: 0.4 x10E3/uL (ref 0.1–0.9)
Monocytes: 7 %
Neutrophils Absolute: 3.5 x10E3/uL (ref 1.4–7.0)
Neutrophils: 63 %
Platelets: 110 x10E3/uL — ABNORMAL LOW (ref 150–450)
RBC: 5.11 x10E6/uL (ref 4.14–5.80)
RDW: 13.1 % (ref 11.6–15.4)
WBC: 5.6 x10E3/uL (ref 3.4–10.8)

## 2024-07-09 LAB — LIPID PANEL
Chol/HDL Ratio: 3.1 ratio (ref 0.0–5.0)
Cholesterol, Total: 148 mg/dL (ref 100–199)
HDL: 47 mg/dL (ref >39)
LDL Chol Calc (NIH): 89 mg/dL (ref 0–99)
Triglycerides: 56 mg/dL (ref 0–149)
VLDL Cholesterol Cal: 12 mg/dL (ref 5–40)

## 2024-07-09 LAB — COMPREHENSIVE METABOLIC PANEL WITH GFR
ALT: 21 IU/L (ref 0–44)
AST: 19 IU/L (ref 0–40)
Albumin: 4.7 g/dL (ref 3.8–4.9)
Alkaline Phosphatase: 70 IU/L (ref 47–123)
BUN/Creatinine Ratio: 15 (ref 9–20)
BUN: 12 mg/dL (ref 6–24)
Bilirubin Total: 1 mg/dL (ref 0.0–1.2)
CO2: 22 mmol/L (ref 20–29)
Calcium: 10 mg/dL (ref 8.7–10.2)
Chloride: 104 mmol/L (ref 96–106)
Creatinine, Ser: 0.8 mg/dL (ref 0.76–1.27)
Globulin, Total: 2.3 g/dL (ref 1.5–4.5)
Glucose: 86 mg/dL (ref 70–99)
Potassium: 4.4 mmol/L (ref 3.5–5.2)
Sodium: 141 mmol/L (ref 134–144)
Total Protein: 7 g/dL (ref 6.0–8.5)
eGFR: 106 mL/min/1.73 (ref >59)

## 2024-07-09 LAB — HEMOGLOBIN A1C
Est. average glucose Bld gHb Est-mCnc: 105 mg/dL
Hgb A1c MFr Bld: 5.3 % (ref 4.8–5.6)

## 2024-07-09 LAB — TSH: TSH: 1.16 u[IU]/mL (ref 0.450–4.500)

## 2024-07-15 ENCOUNTER — Encounter: Payer: Self-pay | Admitting: Family Medicine

## 2024-07-15 ENCOUNTER — Ambulatory Visit (INDEPENDENT_AMBULATORY_CARE_PROVIDER_SITE_OTHER): Payer: PRIVATE HEALTH INSURANCE | Admitting: Family Medicine

## 2024-07-15 VITALS — BP 115/66 | HR 59 | Ht 71.0 in | Wt 178.1 lb

## 2024-07-15 DIAGNOSIS — Z87891 Personal history of nicotine dependence: Secondary | ICD-10-CM | POA: Diagnosis not present

## 2024-07-15 DIAGNOSIS — N529 Male erectile dysfunction, unspecified: Secondary | ICD-10-CM | POA: Diagnosis not present

## 2024-07-15 DIAGNOSIS — Z0000 Encounter for general adult medical examination without abnormal findings: Secondary | ICD-10-CM

## 2024-07-15 DIAGNOSIS — E782 Mixed hyperlipidemia: Secondary | ICD-10-CM

## 2024-07-15 MED ORDER — SILDENAFIL CITRATE 50 MG PO TABS: 50.0000 mg | ORAL_TABLET | Freq: Every day | ORAL | 2 refills | Status: AC | PRN

## 2024-07-15 NOTE — Patient Instructions (Signed)
 It was nice to see you today,  We addressed the following topics today: - I have sent a prescription for sildenafil  to your pharmacy at [facility]. - I have placed an order for a CT scan of your chest. Someone from an imaging center like [facility] will call you to schedule the appointment. Please complete this once a year. - Please schedule your six-month follow-up appointment at the front desk before you leave.  Have a great day,  Rolan Slain, MD

## 2024-07-15 NOTE — Progress Notes (Unsigned)
 Annual physical  Subjective   Patient ID: Zachary Leon, male    DOB: Oct 26, 1970  Age: 53 y.o. MRN: [id number]  Chief Complaint  Patient presents with   Annual Exam   HPI Rufus is a 53 y.o. old male here  for annual exam.   Subjective - Follow-up to review lab results from recent physical. Reports concern for high blood sugar due to recent increase in consumption of sweets. - States tirzepatide curbs appetite but not for sweets. - Reports stopping Flomax , denies issues with urination. - Reports occasional use of PT-141 (bremelanotide) injection, obtained online by his partner, for increased libido. - Quit smoking cigarettes nine months ago, with approximately six cigarettes smoked in the last three months. Denies current cigarette or vape use. Uses marijuana.  Medications Currently taking Linzess , tirzepatide, a statin, baclofen, oxycodone , and sildenafil . Has stopped taking Flomax . Occasional use of bremelanotide (PT-141).  PMH, PSH, FH, Social Hx PMHx: Mildly low but stable platelets. History of elevated cholesterol. Social Hx: Former smoker, quit nine months ago. Reports current marijuana use.  ROS GU: Denies dysuria or urinary hesitancy.    The 10-year ASCVD risk score (Arnett DK, et al., 2019) is: 5.6%  Health Maintenance Due  Topic Date Due   Pneumococcal Vaccine: 50+ Years (1 of 2 - PCV) Never done   Hepatitis B Vaccines 19-59 Average Risk (1 of 3 - 19+ 3-dose series) Never done   Zoster Vaccines- Shingrix (1 of 2) Never done   COVID-19 Vaccine (1 - 2025-26 season) Never done      Objective:     BP 115/66   Pulse (!) 59   Ht 5' 11 (1.803 m)   Wt 178 lb 1.9 oz (80.8 kg)   SpO2 99%   BMI 24.84 kg/m    Physical Exam Gen: alert, oriented HEENT: perrla, eomi, mmm CV: rrr, no murmur Pulm: lctab. No wheeze or crackles.  GI: soft, nbs.  Nontender to palpation MSK: strength equal b/l. Normal gait Ext: no pedal edema Skin: warm and dry, no  rashes Psych: pleasant affect.  Spontaneous speech   No results found for any visits on 07/15/24.      Assessment & Plan:   Physical exam, annual  Former smoker -     CT CHEST LUNG CANCER SCREENING LOW DOSE WO CONTRAST; Future -     CBC with Differential/Platelet; Future  Healthcare maintenance Assessment & Plan: History of smoking places him at risk for lung cancer. Discussed screening. - Ordered low-dose CT scan of the chest for lung cancer screening. To be performed annually. - Declined influenza, pneumococcal, and shingles vaccines at this time. - Colonoscopy not due for 6 years. Tdap is up to date.     Vasculogenic erectile dysfunction, unspecified vasculogenic erectile dysfunction type Assessment & Plan: - Refilled sildenafil  prescription. - counseled pt on safety of 'peptides' he may get from an online source.  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    Mixed hyperlipidemia Assessment & Plan: At goal.  Continue atorvastatin  20mg   Orders: -     Comprehensive metabolic panel with GFR; Future -     CBC with Differential/Platelet; Future -     Lipid panel; Future  Other orders -     Sildenafil  Citrate; Take 1 tablet (50 mg total) by mouth daily as needed for erectile dysfunction.  Dispense: 30 tablet; Refill:  2     Return in about 6 months (around 01/13/2025) for hld.    Toribio MARLA Slain, MD

## 2024-07-16 DIAGNOSIS — Z0000 Encounter for general adult medical examination without abnormal findings: Secondary | ICD-10-CM | POA: Insufficient documentation

## 2024-07-16 NOTE — Assessment & Plan Note (Signed)
 At goal.  Continue atorvastatin  20mg 

## 2024-07-16 NOTE — Assessment & Plan Note (Signed)
-   Refilled sildenafil  prescription. - counseled pt on safety of 'peptides' he may get from an online source.  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

## 2024-07-16 NOTE — Assessment & Plan Note (Signed)
 History of smoking places him at risk for lung cancer. Discussed screening. - Ordered low-dose CT scan of the chest for lung cancer screening. To be performed annually. - Declined influenza, pneumococcal, and shingles vaccines at this time. - Colonoscopy not due for 6 years. Tdap is up to date.

## 2024-08-02 ENCOUNTER — Ambulatory Visit
Admission: RE | Admit: 2024-08-02 | Discharge: 2024-08-02 | Disposition: A | Discharge status: Home / Self Care | Payer: PRIVATE HEALTH INSURANCE | Source: Ambulatory Visit | Attending: Family Medicine | Admitting: Family Medicine

## 2024-08-02 DIAGNOSIS — Z87891 Personal history of nicotine dependence: Secondary | ICD-10-CM

## 2024-08-12 ENCOUNTER — Ambulatory Visit: Payer: Self-pay | Admitting: Family Medicine

## 2024-09-04 ENCOUNTER — Ambulatory Visit: Payer: PRIVATE HEALTH INSURANCE

## 2024-09-16 ENCOUNTER — Encounter: Payer: Self-pay | Admitting: Family Medicine

## 2024-09-16 ENCOUNTER — Ambulatory Visit: Admitting: Family Medicine

## 2024-09-16 VITALS — BP 109/72 | HR 72 | Ht 71.0 in | Wt 180.1 lb

## 2024-09-16 DIAGNOSIS — G8921 Chronic pain due to trauma: Secondary | ICD-10-CM | POA: Diagnosis not present

## 2024-09-16 DIAGNOSIS — K5909 Other constipation: Secondary | ICD-10-CM | POA: Diagnosis not present

## 2024-09-16 MED ORDER — LINACLOTIDE 145 MCG PO CAPS: 145.0000 ug | ORAL_CAPSULE | Freq: Every day | ORAL | 3 refills | Status: AC

## 2024-09-16 NOTE — Patient Instructions (Addendum)
" °  VISIT SUMMARY: Today, you were seen for a flare-up of your chronic back pain and ongoing issues with constipation. You reported that your back pain worsened yesterday and radiated down both legs, affecting your sleep. You also mentioned chronic constipation, which you suspect may be linked to your back pain. We discussed your current medications and potential adjustments to help manage your symptoms.  YOUR PLAN: -LOW BACK PAIN: Low back pain is discomfort in the lower back area, which can sometimes radiate to the legs. We will wait six weeks to see if the pain resolves naturally before considering an MRI. If your constipation improves but the back pain persists, we may consider prescribing prednisone .  -OPIOID-INDUCED CONSTIPATION: Opioid-induced constipation is a common side effect of opioid medications, causing infrequent and difficult bowel movements. We recommend increasing your Linzess  dose to 145 mcg daily and starting daily Miralax, beginning with one capful in 4 oz of water daily and increasing as needed. If Linzess  is not effective, you may use glycerin suppositories. We can also explore other prescription options if necessary.  INSTRUCTIONS: Please follow up in six weeks to reassess your back pain. If your symptoms do not improve or worsen, contact our office sooner. Continue with the adjusted medication regimen for constipation and monitor your symptoms closely.   "

## 2024-09-16 NOTE — Assessment & Plan Note (Signed)
 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 Linzess  145 mcg weekly with variable response. Discussed stool impaction and Miralax role. - Increase Linzess  to 145 mcg daily. - Initiate daily Miralax, starting with one capful and increasing as needed. - Consider glycerin suppositories if Linzess  is ineffective. - Explore prescription options for opioid-induced constipation if needed.

## 2024-09-16 NOTE — Progress Notes (Unsigned)
 "  Established Patient Office Visit  Subjective   Patient ID: Zachary Leon, male    DOB: 1971-02-28  Age: 53 y.o. MRN: [id number]  Chief Complaint  Patient presents with   Back Pain    Discussed the use of AI scribe software for clinical note transcription with the patient, who gave verbal consent to proceed.  History of Present Illness   Zachary Leon is a 53 year old male with chronic back pain and constipation who presents with a flare-up of back pain.  He experienced a new flare-up of back pain that began yesterday morning with a feeling of tightness upon waking. Despite attempts to alleviate the pain by moving around, it progressively worsened throughout the day. Overnight, he experienced severe pain radiating down both legs, which prevented him from sleeping. He suspects the pain may be related to constipation, as a small bowel movement at 4 AM provided some relief but did not resolve the pain completely.  He experiences chronic constipation, having bowel movements only once a week, which are minimal in amount. He takes Linzess  for constipation, usually two 145 mcg tablets once a week, as the prescribed 72 mcg dose is ineffective. He also drinks a quart of apple juice with Linzess  to aid bowel movements. He has tried Metamucil and Miralax in the past, but they were ineffective. He is concerned about potential bowel impaction.  He takes oxycodone for pain management, prescribed by [facility], and doubled his dose last night due to severe pain. He attempts self-directed physical therapy due to work constraints and has not engaged in any recent activities that could have exacerbated his back pain.  He reports difficulty concentrating at work and is interested in exploring options to improve focus. He reports that he has not engaged in any recent activities or sustained any injury that could have exacerbated his back pain. The pain radiates down both legs, particularly severe  last night. He has not used suppositories for constipation.          The 10-year ASCVD risk score (Arnett DK, et al., 2019) is: 5.1%  Health Maintenance Due  Topic Date Due   Pneumococcal Vaccine: 50+ Years (1 of 2 - PCV) Never done   Hepatitis B Vaccines 19-59 Average Risk (1 of 3 - 19+ 3-dose series) Never done   Zoster Vaccines- Shingrix (1 of 2) Never done   COVID-19 Vaccine (1 - 2025-26 season) Never done      Objective:     BP 109/72   Pulse 72   Ht 5' 11 (1.803 m)   Wt 180 lb 1.9 oz (81.7 kg)   SpO2 98%   BMI 25.12 kg/m  {Vitals History (Optional):23777}  Physical Exam     Gen: alert, oriented Pulm: no respiratory distress Msk: minimal ttp over the lumbar spine.  Negative straight legged test b/l.  Psych: pleasant affect       No results found for any visits on 09/16/24.      Assessment & Plan:   Chronic pain due to trauma Assessment & Plan: Acute flare-up with bilateral leg radiation, possibly linked to constipation. MRI deferred for six weeks to assess natural resolution. - Consider MRI if pain persists after six weeks. - Consider prednisone  if constipation improves but back pain persists.   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 Linzess  145 mcg weekly with variable response. Discussed stool impaction and Miralax role. - Increase Linzess  to 145 mcg daily. -  Initiate daily Miralax, starting with one capful and increasing as needed. - Consider glycerin suppositories if Linzess  is ineffective. - Explore prescription options for opioid-induced constipation if needed.    Other orders -     linaCLOtide ; Take 1 capsule (145 mcg total) by mouth daily before breakfast.  Dispense: 30 capsule; Refill: 3       No follow-ups on file.    Toribio MARLA Slain, MD  "

## 2024-09-16 NOTE — Assessment & Plan Note (Signed)
 Acute flare-up with bilateral leg radiation, possibly linked to constipation. MRI deferred for six weeks to assess natural resolution. - Consider MRI if pain persists after six weeks. - Consider prednisone  if constipation improves but back pain persists.

## 2024-09-17 ENCOUNTER — Ambulatory Visit: Payer: PRIVATE HEALTH INSURANCE

## 2024-11-27 ENCOUNTER — Other Ambulatory Visit: Payer: PRIVATE HEALTH INSURANCE

## 2024-12-04 ENCOUNTER — Ambulatory Visit: Payer: PRIVATE HEALTH INSURANCE | Admitting: Family Medicine

## 2025-01-07 ENCOUNTER — Other Ambulatory Visit: Payer: PRIVATE HEALTH INSURANCE

## 2025-01-14 ENCOUNTER — Ambulatory Visit: Payer: PRIVATE HEALTH INSURANCE | Admitting: Family Medicine
